# Patient Record
Sex: Male | Born: 1940 | Hispanic: No | Marital: Married | Smoking: Former smoker
Health system: Southern US, Community
[De-identification: ages and names within clinical notes are randomized; demographics above are authoritative.]

## PROBLEM LIST (undated history)

## (undated) DIAGNOSIS — I10 Essential (primary) hypertension: Secondary | ICD-10-CM

---

## 2016-06-07 ENCOUNTER — Ambulatory Visit (HOSPITAL_BASED_OUTPATIENT_CLINIC_OR_DEPARTMENT_OTHER)
Admission: RE | Admit: 2016-06-07 | Discharge: 2016-06-07 | Disposition: A | Discharge status: Home / Self Care | Payer: Medicare Other | Source: Ambulatory Visit | Attending: Sports Medicine | Admitting: Sports Medicine

## 2016-06-07 ENCOUNTER — Ambulatory Visit (INDEPENDENT_AMBULATORY_CARE_PROVIDER_SITE_OTHER): Payer: Medicare Other | Admitting: Sports Medicine

## 2016-06-07 ENCOUNTER — Encounter (HOSPITAL_BASED_OUTPATIENT_CLINIC_OR_DEPARTMENT_OTHER): Payer: Self-pay

## 2016-06-07 ENCOUNTER — Encounter: Payer: Self-pay | Admitting: Sports Medicine

## 2016-06-07 ENCOUNTER — Ambulatory Visit (INDEPENDENT_AMBULATORY_CARE_PROVIDER_SITE_OTHER): Payer: Medicare Other

## 2016-06-07 VITALS — BP 190/106 | HR 51 | Resp 18 | Ht 65.25 in | Wt 162.1 lb

## 2016-06-07 DIAGNOSIS — M438X6 Other specified deforming dorsopathies, lumbar region: Secondary | ICD-10-CM | POA: Diagnosis not present

## 2016-06-07 DIAGNOSIS — I723 Aneurysm of iliac artery: Secondary | ICD-10-CM | POA: Diagnosis not present

## 2016-06-07 DIAGNOSIS — I10 Essential (primary) hypertension: Secondary | ICD-10-CM | POA: Diagnosis not present

## 2016-06-07 DIAGNOSIS — I714 Abdominal aortic aneurysm, without rupture, unspecified: Secondary | ICD-10-CM

## 2016-06-07 DIAGNOSIS — M545 Low back pain, unspecified: Secondary | ICD-10-CM | POA: Insufficient documentation

## 2016-06-07 DIAGNOSIS — K219 Gastro-esophageal reflux disease without esophagitis: Secondary | ICD-10-CM

## 2016-06-07 DIAGNOSIS — Z0000 Encounter for general adult medical examination without abnormal findings: Secondary | ICD-10-CM | POA: Insufficient documentation

## 2016-06-07 DIAGNOSIS — K5900 Constipation, unspecified: Secondary | ICD-10-CM | POA: Diagnosis not present

## 2016-06-07 DIAGNOSIS — H8113 Benign paroxysmal vertigo, bilateral: Secondary | ICD-10-CM | POA: Diagnosis not present

## 2016-06-07 DIAGNOSIS — I2510 Atherosclerotic heart disease of native coronary artery without angina pectoris: Secondary | ICD-10-CM | POA: Diagnosis not present

## 2016-06-07 DIAGNOSIS — M5136 Other intervertebral disc degeneration, lumbar region: Secondary | ICD-10-CM | POA: Diagnosis not present

## 2016-06-07 DIAGNOSIS — K5730 Diverticulosis of large intestine without perforation or abscess without bleeding: Secondary | ICD-10-CM | POA: Diagnosis not present

## 2016-06-07 DIAGNOSIS — I700 Atherosclerosis of aorta: Secondary | ICD-10-CM | POA: Diagnosis not present

## 2016-06-07 DIAGNOSIS — G4700 Insomnia, unspecified: Secondary | ICD-10-CM | POA: Insufficient documentation

## 2016-06-07 DIAGNOSIS — N400 Enlarged prostate without lower urinary tract symptoms: Secondary | ICD-10-CM | POA: Insufficient documentation

## 2016-06-07 DIAGNOSIS — E785 Hyperlipidemia, unspecified: Secondary | ICD-10-CM | POA: Insufficient documentation

## 2016-06-07 DIAGNOSIS — Z955 Presence of coronary angioplasty implant and graft: Secondary | ICD-10-CM

## 2016-06-07 DIAGNOSIS — H8110 Benign paroxysmal vertigo, unspecified ear: Secondary | ICD-10-CM | POA: Insufficient documentation

## 2016-06-07 DIAGNOSIS — R911 Solitary pulmonary nodule: Secondary | ICD-10-CM | POA: Insufficient documentation

## 2016-06-07 HISTORY — DX: Essential (primary) hypertension: I10

## 2016-06-07 LAB — POCT URINALYSIS DIPSTICK
Bilirubin, UA: NEGATIVE
Blood, UA: NEGATIVE
Glucose, UA: NEGATIVE
Ketones, UA: NEGATIVE
Leukocytes, UA: NEGATIVE
Nitrite, UA: NEGATIVE
Protein, UA: NEGATIVE
Spec Grav, UA: 1.02
Urobilinogen, UA: 0.2
pH, UA: 5.5

## 2016-06-07 LAB — COMPLETE METABOLIC PANEL WITH GFR
Albumin: 4.5 g/dL (ref 3.6–5.1)
Alkaline Phosphatase: 54 U/L (ref 40–115)
Calcium: 10.4 mg/dL — ABNORMAL HIGH (ref 8.6–10.3)
GFR, Est Non African American: 60 mL/min (ref >=60)
Potassium: 4.4 mmol/L (ref 3.5–5.3)
Sodium: 139 mmol/L (ref 135–146)

## 2016-06-07 LAB — COMPLETE METABOLIC PANEL WITHOUT GFR
ALT: 12 U/L (ref 9–46)
AST: 19 U/L (ref 10–35)
BUN: 14 mg/dL (ref 7–25)
CO2: 23 mmol/L (ref 20–31)
Chloride: 100 mmol/L (ref 98–110)
Creat: 1.17 mg/dL (ref 0.70–1.18)
GFR, Est African American: 70 mL/min (ref >=60)
Glucose, Bld: 99 mg/dL (ref 65–99)
Total Bilirubin: 0.5 mg/dL (ref 0.2–1.2)
Total Protein: 7.8 g/dL (ref 6.1–8.1)

## 2016-06-07 MED ORDER — IOPAMIDOL (ISOVUE-370) INJECTION 76%
100.0000 mL | Freq: Once | INTRAVENOUS | Status: AC | PRN
  Administered 2016-06-07: 100 mL via INTRAVENOUS

## 2016-06-07 MED ORDER — MECLIZINE HCL 12.5 MG PO TABS: 12.5000 mg | ORAL_TABLET | Freq: Two times a day (BID) | ORAL | Status: AC

## 2016-06-07 MED ORDER — PREDNISONE 50 MG PO TABS: ORAL_TABLET | ORAL | Status: DC

## 2016-06-07 MED ORDER — VALSARTAN-HYDROCHLOROTHIAZIDE 320-25 MG PO TABS: 0.5000 | ORAL_TABLET | Freq: Every day | ORAL | Status: DC

## 2016-06-07 NOTE — Assessment & Plan Note (Signed)
Patient will take meclizine twice a day, referral to ENT, he does have some tinnitus that is suspicious for Mnire's disease. Adding vestibular rehabilitation.

## 2016-06-07 NOTE — Addendum Note (Signed)
Addended by: Monica BectonHEKKEKANDAM, Major Santerre J on: 06/07/2016 01:32 PM   Modules accepted: Orders

## 2016-06-07 NOTE — Addendum Note (Signed)
Addended by: Baird KayUGLAS, Koree Staheli M on: 06/07/2016 04:54 PM   Modules accepted: Orders

## 2016-06-07 NOTE — Addendum Note (Signed)
Addended by: Chalmers CaterUTTLE, Chasitee Zenker H on: 06/07/2016 02:54 PM   Modules accepted: Orders

## 2016-06-07 NOTE — Assessment & Plan Note (Signed)
Rechecking lipids. He is getting some myalgias, checking his CK level and ESR 

## 2016-06-07 NOTE — Assessment & Plan Note (Signed)
Adding screening for aortic aneurysm, patient is a former smoker

## 2016-06-07 NOTE — Progress Notes (Signed)
  Subjective:    CC: Establish care.   HPI:  This is a pleasant 75 year old male establishing care previous [place] in [place]. He has multiple complaints.  Back pain: Axial with radiation around to the anterior abdomen, no constitutional symptoms or hematuria. No dysuria. Worse with sitting, flexion, Valsalva, no overt radicular symptoms.  Dizziness: Described as predominant vertiginous with head movement, was given meclizine which he takes intermittently for a partial and temporary response.  Has never done vestibular rehabilitation, denies any symptoms of presyncope. In further questioning does have some moderate to severe tinnitus associated with the dizzy spells.  Hypertension: Uncontrolled, no headaches, visual changes, chest pain  Hyperlipidemia: Stable on pravastatin, is getting some hip girdle muscle aching  BPH: Stable on current medications  GERD: Stable on current medications  Insomnia: Stable with occasional Ambien use prescribed by another provider.  Coronary artery disease: Had an angiography with stenting proximally one year ago, no further episodes of chest pain.  Past medical history, Surgical history, Family history not pertinant except as noted below, Social history, Allergies, and medications have been entered into the medical record, reviewed, and no changes needed.   Review of Systems: No headache, visual changes, nausea, vomiting, diarrhea, constipation, dizziness, abdominal pain, skin rash, fevers, chills, night sweats, swollen lymph nodes, weight loss, chest pain, body aches, joint swelling, muscle aches, shortness of breath, mood changes, visual or auditory hallucinations.  Objective:    General: Well Developed, well nourished, and in no acute distress.  Neuro: Alert and oriented x3, extra-ocular muscles intact, sensation grossly intact.  HEENT: Normocephalic, atraumatic, pupils equal round reactive to light, neck supple, no masses, no lymphadenopathy, thyroid  nonpalpable. Oropharynx, nasopharynx, ear canals unremarkable, mild tympanosclerosis. Skin: Warm and dry, no rashes noted.  Cardiac: Regular rate and rhythm, no murmurs rubs or gallops.  Respiratory: Clear to auscultation bilaterally. Not using accessory muscles, speaking in full sentences.  Abdominal: Soft, nontender, nondistended, positive bowel sounds, no masses, no organomegaly.  Musculoskeletal: Shoulder, elbow, wrist, hip, knee, ankle stable, and with full range of motion. Back Exam:  Inspection: Unremarkable  Motion: Flexion 45 deg, Extension 45 deg, Side Bending to 45 deg bilaterally,  Rotation to 45 deg bilaterally  SLR laying: Negative  XSLR laying: Negative  Palpable tenderness: None. FABER: negative. Sensory change: Gross sensation intact to all lumbar and sacral dermatomes.  Reflexes: 2+ at both patellar tendons, 2+ at achilles tendons, Babinski's downgoing.  Strength at foot  Plantar-flexion: 5/5 Dorsi-flexion: 5/5 Eversion: 5/5 Inversion: 5/5  Leg strength  Quad: 5/5 Hamstring: 5/5 Hip flexor: 5/5 Hip abductors: 5/5  Gait unremarkable.   Impression and Recommendations:    The patient was counselled, risk factors were discussed, anticipatory guidance given.  I spent 60 minutes with this patient, greater than 50% was face-to-face time counseling regarding the above multiple diagnoses.

## 2016-06-07 NOTE — Assessment & Plan Note (Signed)
Noted calcified aneurysmal dilation rotation of the abdominal aorta on x-rays, combined with back pain and a blood pressure that highly elevated we do need an urgent abdominal aortic ultrasound, we cannot do a downstairs, patient will be diverted to [facility] for STAT CT abdomen and pelvis.

## 2016-06-07 NOTE — Assessment & Plan Note (Signed)
With a systolic murmur. Adding an echocardiogram. Blood work ordered.

## 2016-06-07 NOTE — Assessment & Plan Note (Signed)
Post MI with stenting. Continue the Bisoprolol, adding valsartan/HCTZ

## 2016-06-07 NOTE — Assessment & Plan Note (Signed)
X-rays, prednisone formal physical therapy, MRI. We are going to proceed with interventional planning, he has had physical therapy in [place] without any improvement.

## 2016-06-08 LAB — CBC
HCT: 45.2 % (ref 38.5–50.0)
Hemoglobin: 15.2 g/dL (ref 13.2–17.1)
MCH: 30.8 pg (ref 27.0–33.0)
MCHC: 33.6 g/dL (ref 32.0–36.0)
MCV: 91.5 fL (ref 80.0–100.0)
MPV: 11.1 fL (ref 7.5–12.5)
Platelets: 141 K/uL (ref 140–400)
RBC: 4.94 MIL/uL (ref 4.20–5.80)
RDW: 14.2 % (ref 11.0–15.0)
WBC: 6.4 10*3/uL (ref 3.8–10.8)

## 2016-06-08 LAB — TSH: TSH: 2.29 m[IU]/L (ref 0.40–4.50)

## 2016-06-08 LAB — HEMOGLOBIN A1C
Hgb A1c MFr Bld: 6.1 % — ABNORMAL HIGH (ref <5.7)
Mean Plasma Glucose: 128 mg/dL

## 2016-06-08 LAB — LIPID PANEL
Cholesterol: 178 mg/dL (ref 125–200)
HDL: 35 mg/dL — ABNORMAL LOW (ref >=40)
Total CHOL/HDL Ratio: 5.1 Ratio — ABNORMAL HIGH (ref <=5.0)
Triglycerides: 482 mg/dL — ABNORMAL HIGH (ref <150)

## 2016-06-08 LAB — CK: Total CK: 56 U/L (ref 7–232)

## 2016-06-08 LAB — SEDIMENTATION RATE: Sed Rate: 4 mm/hr (ref 0–20)

## 2016-06-10 ENCOUNTER — Ambulatory Visit (HOSPITAL_BASED_OUTPATIENT_CLINIC_OR_DEPARTMENT_OTHER)
Admission: RE | Admit: 2016-06-10 | Discharge: 2016-06-10 | Disposition: A | Discharge status: Home / Self Care | Payer: Medicare Other | Source: Ambulatory Visit | Attending: Sports Medicine | Admitting: Sports Medicine

## 2016-06-10 ENCOUNTER — Other Ambulatory Visit: Payer: Self-pay | Admitting: Sports Medicine

## 2016-06-10 DIAGNOSIS — I700 Atherosclerosis of aorta: Secondary | ICD-10-CM | POA: Insufficient documentation

## 2016-06-10 DIAGNOSIS — Z87891 Personal history of nicotine dependence: Secondary | ICD-10-CM | POA: Insufficient documentation

## 2016-06-10 DIAGNOSIS — I723 Aneurysm of iliac artery: Secondary | ICD-10-CM

## 2016-06-10 DIAGNOSIS — Z0000 Encounter for general adult medical examination without abnormal findings: Secondary | ICD-10-CM | POA: Insufficient documentation

## 2016-06-11 ENCOUNTER — Encounter: Payer: Self-pay | Admitting: Physical Therapy

## 2016-06-11 ENCOUNTER — Ambulatory Visit: Payer: Medicare Other | Attending: Sports Medicine | Admitting: Physical Therapy

## 2016-06-11 DIAGNOSIS — M545 Low back pain: Secondary | ICD-10-CM | POA: Insufficient documentation

## 2016-06-11 DIAGNOSIS — H8112 Benign paroxysmal vertigo, left ear: Secondary | ICD-10-CM | POA: Diagnosis present

## 2016-06-11 DIAGNOSIS — R42 Dizziness and giddiness: Secondary | ICD-10-CM | POA: Diagnosis present

## 2016-06-11 DIAGNOSIS — R2681 Unsteadiness on feet: Secondary | ICD-10-CM | POA: Insufficient documentation

## 2016-06-11 DIAGNOSIS — R2689 Other abnormalities of gait and mobility: Secondary | ICD-10-CM | POA: Diagnosis present

## 2016-06-11 NOTE — Therapy (Signed)
[facility] [address] [place], [place], [address] Phone: [phone]   Fax:  [phone]  Physical Therapy Evaluation  Patient Details  Name: Dominic Wallace MRN: [id number] Date of Birth: 1941-10-09 Referring Provider: Darlin Priestly, MD  Encounter Date: 06/11/2016      PT End of Session - 06/11/16 1002    Visit Number 1   Number of Visits 9  eval + 8 visits   Date for PT Re-Evaluation 07/11/16   Authorization Type BCBS Medicare primary; [employer] secondary   Authorization Time Period G Codes required   PT Start Time [phone]   PT Stop Time 0936   PT Time Calculation (min) 53 min   Activity Tolerance Patient tolerated treatment well   Behavior During Therapy [facility] for tasks assessed/performed      Past Medical History  Diagnosis Date  . Hypertension     History reviewed. No pertinent past surgical history.  There were no vitals filed for this visit.       Subjective Assessment - 06/11/16 0853    Subjective Dizziness began after CVA approx. 1 year ago. Was under the impression that dizziness would resolve after surgery for AAA, but it did not. Notes BP has been elevated recently; Dr. Karie Schwalbe increased meds at last office visit on 7/14. Reports "dizzy attacks" occur when standing on his feet.    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 No/denies            [facility] PT Assessment - 06/11/16 0001    Assessment   Medical Diagnosis Benign paroxysmal positional vertigo, bilateral   Referring Provider Darlin Priestly, MD   Onset Date/Surgical Date 05/26/15   Precautions   Precautions Fall   Restrictions   Weight Bearing Restrictions No   Balance Screen   Has the patient fallen in the  past 6 months No   Has the patient had a decrease in activity level because of a fear of falling?  Yes   Is the patient reluctant to leave their home because of a fear of falling?  No   Home Environment   Living Environment Private residence   Living Arrangements Spouse/significant other   Type of Home House   Home Access Level entry   Home Layout One level   Home Equipment Walker - 2 wheels;Gilmer Mor - single point   Prior Function   Level of Independence Independent   Vocation Retired   Leisure likes to play with grandkids and great grandkids   Cognition   Overall Cognitive Status History of cognitive impairments - at baseline  impaired ST memory, per pt/wife.            Vestibular Assessment - 06/11/16 0001    Vestibular Assessment   General Observation Pt did take meclizine this morning.   Symptom Behavior   Type of Dizziness Spinning  and secondary imbalance   Frequency of Dizziness spinning occurs intermittently; imbalance occurs everytime pt stands/ambulates   Duration of Dizziness spinning: hours; imbalance: as long as head/body moving   Aggravating Factors Activity in general;Spontaneous onset;Turning body quickly;Turning head quickly;Supine to sit   Relieving Factors No known relieving factors   Occulomotor Exam   Occulomotor Alignment Abnormal  R eye abducted  Spontaneous Absent   Gaze-induced Left beating nystagmus with L gaze   Smooth Pursuits Intact  grossly   Saccades Dysmetria  hypometric in all planes but hypermetric with horizont R > L   Comment Convergence appears impaired in R eye.   Vestibulo-Occular Reflex   VOR 1 Head Only (x 1 viewing) Impaired and symptomatic with horizontal head movement.   VOR Cancellation Normal   Comment (+) B Head Thrust Test   Positional Testing   Dix-Hallpike Dix-Hallpike Right;Dix-Hallpike Left   Sidelying Test Sidelying Right;Sidelying Left   Horizontal Canal Testing Horizontal Canal Right;Horizontal Canal  Left;Horizontal Canal Right Intensity;Horizontal Canal Left Intensity   Dix-Hallpike Right   Dix-Hallpike Right Duration NA   Dix-Hallpike Right Symptoms No nystagmus   Dix-Hallpike Left   Dix-Hallpike Left Duration Approx 5 seconds   Dix-Hallpike Left Symptoms Upbeat, left rotatory nystagmus   Sidelying Right   Sidelying Right Duration NA   Sidelying Right Symptoms No nystagmus   Sidelying Left   Sidelying Left Duration NA   Sidelying Left Symptoms No nystagmus   Horizontal Canal Right   Horizontal Canal Right Duration NA   Horizontal Canal Right Symptoms Normal   Horizontal Canal Left   Horizontal Canal Left Duration NA   Horizontal Canal Left Symptoms Normal   Orthostatics   BP sitting 168/98 mmHg  manual BP   HR sitting 53   Orthostatics Comment When taken with dynamap, BP is 180/100 (seated).               [facility] Adult PT Treatment/Exercise - 06/11/16 0001    Bed Mobility   Bed Mobility Supine to Sit;Sit to Supine   Supine to Sit 4: Min assist   Supine to Sit Details (indicate cue type and reason) cueing for technique   Sit to Supine 4: Min assist   Sit to Supine - Details (indicate cue type and reason) cueing for technique   Transfers   Transfers Sit to Stand;Stand to Sit   Sit to Stand 5: Supervision;4: Min guard   Stand to Sit 5: Supervision   Ambulation/Gait   Ambulation/Gait Yes   Ambulation/Gait Assistance 5: Supervision;4: Min guard   Ambulation Distance (Feet) 130 Feet  x2   Assistive device None   Gait Pattern Step-through pattern;Decreased arm swing - right;Decreased arm swing - left;Decreased stride length;Lateral trunk lean to right;Wide base of support  BUE's in high guard position; turns en bloc   Ambulation Surface Level;Indoor         Vestibular Treatment/Exercise - 06/11/16 0001    Vestibular Treatment/Exercise   Vestibular Treatment Provided Canalith Repositioning   Canalith Repositioning Epley Manuever Left    EPLEY MANUEVER LEFT    Number of Reps  1    RESPONSE DETAILS LEFT Unable to reassess due to time constraint.               PT Education - 06/11/16 0947    Education provided Yes   Education Details PT eval findings, goals, and POC. Explained risk of elevated BP given h/o CVA. Recommended pt contact primary MD immediately after session to notify of elevated BP.    Person(s) Educated Patient;Spouse   Methods Explanation   Comprehension Verbalized understanding          PT Short Term Goals - 06/11/16 1004    PT SHORT TERM GOAL #1   Title STG's = LTG's           PT Long Term Goals - 06/11/16 1004  PT LONG TERM GOAL #1   Title Rule out orthostatic hypotension as origin of dizziness. (Target date: 07/09/16)   PT LONG TERM GOAL #2   Title Pt will perform HEP with mod I using paper handout to maximize functional gains made in PT.  (06/29/16)   PT LONG TERM GOAL #3   Title Positional vertigo testing will be negative to indicate resolved BPPV.    (06/29/16)   PT LONG TERM GOAL #4   Title Pt will decrease DHI score from 72 to < / = 54 to indicate significant decrease in pt-perceived disability due to dizziness. (06/29/16)   PT LONG TERM GOAL #5   Title Complete Berg and write appropriate LTG.  (06/29/16)               Plan - 06/11/16 1009    Clinical Impression Statement Pt is a 75 y/o M referred to outpatient PT to address vertiginous symptoms. 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 PT evaluation reveals the following: impaired convergence and dysmetric saccades on oculomotor exam; (+) B Head Thrust Test; L Dix-Hallpike wiht L upbeating torsional nystagmus x5 seconds (although pt did take meclizine today) accompanied by concordant vertigo; motion sensitivity with supine > sit; and decreased stability/independence with all aspects of functional mobility. Unable to rule out L posterior canalithiasis, vestibular hypofunction, and impaired visual-vestibular  integration. Performed L Epley maneuver x1; unable to reassess due to time constraint. Seated BP was 168/98; strongly reconmmended pt/wife contact referring MD immediately after this session to notify of continued elevatation of BP despite recent medication change. Pt.wife verbalized understanding and were in full agreement. Pt will benefit from skilled outpatient PT 2x/week for 4 weeks to address said impairments.    Rehab Potential Fair   PT Frequency 2x / week   PT Duration 4 weeks   PT Treatment/Interventions ADLs/Self Care Home Management;Canalith Repostioning;Vestibular;Gait training;Stair training;Neuromuscular re-education;Functional mobility training;Therapeutic activities;Patient/family education;Therapeutic exercise;Balance training;Manual techniques   PT Next Visit Plan Assess orthostatic vital signs. Reassess for BPPV (check to see if pt has had meclizine) and treat prn.   Consulted and Agree with Plan of Care Patient;Family member/caregiver   Family Member Consulted wife, Corrie DandyMary      Patient will benefit from skilled therapeutic intervention in order to improve the following deficits and impairments:  Abnormal gait, Dizziness, Decreased balance  Visit Diagnosis: BPPV (benign paroxysmal positional vertigo), left - Plan: PT plan of care cert/re-cert  Dizziness and giddiness - Plan: PT plan of care cert/re-cert  Other abnormalities of gait and mobility - Plan: PT plan of care cert/re-cert  Unsteadiness on feet - Plan: PT plan of care cert/re-cert      G-Codes - 06/11/16 1008    Functional Assessment Tool Used DHI = 72   Functional Limitation Self care   Self Care Current Status (U0454(G8987) At least 60 percent but less than 80 percent impaired, limited or restricted   Self Care Goal Status (U9811(G8988) At least 40 percent but less than 60 percent impaired, limited or restricted       Problem List Patient Active Problem List   Diagnosis Date Noted  . Essential hypertension, benign  06/07/2016  . Hyperlipidemia 06/07/2016  . Presence of stent in coronary artery in patient with coronary artery disease 06/07/2016  . Benign paroxysmal positional vertigo 06/07/2016  . Low back pain 06/07/2016  . Annual physical exam 06/07/2016  . BPH (benign prostatic hyperplasia) 06/07/2016  . GERD (gastroesophageal reflux disease) 06/07/2016  .  Insomnia 06/07/2016  . Aneurysm of abdominal aorta (HCC) 06/07/2016  Jorje Guild, PT, DPT [facility] [address] [place], [place], [address] Phone: [phone]   Fax:  [phone] 06/11/2016, 10:16 AM  Name: Bently Wyss MRN: [id number] Date of Birth: 1941/09/10

## 2016-06-16 ENCOUNTER — Ambulatory Visit (HOSPITAL_BASED_OUTPATIENT_CLINIC_OR_DEPARTMENT_OTHER)
Admission: RE | Admit: 2016-06-16 | Discharge: 2016-06-16 | Disposition: A | Discharge status: Home / Self Care | Payer: Medicare Other | Source: Ambulatory Visit | Attending: Sports Medicine | Admitting: Sports Medicine

## 2016-06-16 DIAGNOSIS — M5126 Other intervertebral disc displacement, lumbar region: Secondary | ICD-10-CM | POA: Diagnosis not present

## 2016-06-16 DIAGNOSIS — M545 Low back pain, unspecified: Secondary | ICD-10-CM

## 2016-06-16 DIAGNOSIS — M4806 Spinal stenosis, lumbar region: Secondary | ICD-10-CM | POA: Insufficient documentation

## 2016-06-16 DIAGNOSIS — M1288 Other specific arthropathies, not elsewhere classified, other specified site: Secondary | ICD-10-CM | POA: Insufficient documentation

## 2016-06-17 ENCOUNTER — Ambulatory Visit: Payer: Medicare Other | Admitting: Physical Therapy

## 2016-06-17 ENCOUNTER — Telehealth: Payer: Self-pay | Admitting: Sports Medicine

## 2016-06-17 MED ORDER — DUTASTERIDE-TAMSULOSIN HCL 0.5-0.4 MG PO CAPS: 1.0000 | ORAL_CAPSULE | Freq: Every day | ORAL | 3 refills | Status: DC

## 2016-06-17 NOTE — Telephone Encounter (Signed)
Pt's wife came in and stated he needs his Dutasteride/tamsulosin refilled. They stated they will be in [place] for the next couple months and not [place]. Thanks

## 2016-06-18 ENCOUNTER — Ambulatory Visit: Payer: Medicare Other | Admitting: Physical Therapy

## 2016-06-18 ENCOUNTER — Telehealth: Payer: Self-pay | Admitting: Physical Therapy

## 2016-06-18 DIAGNOSIS — R2689 Other abnormalities of gait and mobility: Secondary | ICD-10-CM

## 2016-06-18 DIAGNOSIS — M545 Low back pain, unspecified: Secondary | ICD-10-CM

## 2016-06-18 DIAGNOSIS — H8112 Benign paroxysmal vertigo, left ear: Secondary | ICD-10-CM

## 2016-06-18 DIAGNOSIS — R2681 Unsteadiness on feet: Secondary | ICD-10-CM

## 2016-06-18 DIAGNOSIS — R42 Dizziness and giddiness: Secondary | ICD-10-CM

## 2016-06-18 NOTE — Therapy (Signed)
[facility] [address] [place], [place], [address] Phone: [phone]   Fax:  [phone]  Physical Therapy Treatment  Patient Details  Name: Dominic Wallace MRN: [id number] Date of Birth: [birth year] Referring Provider: Rosalio Loud, MD  Encounter Date: 06/18/2016      PT End of Session - 06/18/16 2009    Visit Number 2   Number of Visits 9   Date for PT Re-Evaluation 07/18/16   Authorization Type BCBS Medicare primary; T[employer] secondary   Authorization Time Period G Codes required   PT Start Time 0928   PT Stop Time 1019   PT Time Calculation (min) 51 min   Activity Tolerance Patient tolerated treatment well   Behavior During Therapy [facility] for tasks assessed/performed      Past Medical History:  Diagnosis Date  . Hypertension     No past surgical history on file.  There were no vitals filed for this visit.      Subjective Assessment - 06/18/16 0929    Subjective "The dizziness is still there, and the vertigo as well. 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 Yes   Pain Score 3    Pain Location Back   Pain Orientation Lower;Right   Pain Type Acute pain   Pain Onset 1 to 4 weeks ago   Pain Frequency Intermittent   Aggravating Factors  bending over; sitting up from lying in bed            [facility] PT Assessment - 06/18/16 0001      Assessment   Medical Diagnosis Benign paroxysmal positional vertigo, bilateral; low back pain   Referring Provider Rosalio Loud, MD   Onset Date/Surgical Date 05/26/15            Vestibular Assessment - 06/18/16 0001      Vestibular  Assessment   General Observation Pt did take meclizine today.     Positional Testing   Dix-Hallpike Dix-Hallpike Right;Dix-Hallpike Left   Horizontal Canal Testing Horizontal Canal Right;Horizontal Canal Left     Dix-Hallpike Left   Dix-Hallpike Left Duration No vertiginous symtoms; however, pt states, "It feels like the dizziness could start."   Dix-Hallpike Left Symptoms No nystagmus     Sidelying Right   Sidelying Right Duration NA   Sidelying Right Symptoms No nystagmus     Sidelying Left   Sidelying Left Duration NA   Sidelying Left Symptoms No nystagmus     Horizontal Canal Right   Horizontal Canal Right Duration NA   Horizontal Canal Right Symptoms Normal     Horizontal Canal Left   Horizontal Canal Left Duration NA   Horizontal Canal Left Symptoms Normal     Orthostatics   BP supine (x 5 minutes) 145/72   HR supine (x 5 minutes) 46   BP standing (after 1 minute) 109/69  symptomatic   HR standing (after 1 minute) 51                 [facility] Adult PT Treatment/Exercise - 06/18/16 0001      Bed Mobility   Bed Mobility Sit to Supine;Supine to  Sit   Supine to Sit 5: Supervision   Supine to Sit Details (indicate cue type and reason) Verbal, tactile cueing for logroll technique for efficiency of movement, decreased back pain.   Sit to Supine 5: Supervision   Sit to Supine - Details (indicate cue type and reason) Verbal, tactile cueing for logroll technique.     Transfers   Transfers Sit to Stand;Stand to Sit   Sit to Stand 5: Supervision   Stand to Sit 5: Supervision     Posture/Postural Control   Posture/Postural Control Postural limitations   Postural Limitations Rounded Shoulders;Forward head;Posterior pelvic tilt;Increased thoracic kyphosis     Self-Care   Self-Care Other Self-Care Comments   Other Self-Care Comments  Discussed strategies for preventing, managing orthostatic hypotension with focus on hydration, postural adjustment, compression  garments, and performance of LE therex. Emphasized importance of static standing > 10 seconds prior to initiating ambulating to decrease fall risk. Pt verbalized understanding. Also explained and demonstrated how seated postural alignment can increase lower back pain. Pt gave effective within-session carryover of improved seated posture.     Exercises   Exercises Other Exercises   Other Exercises  With verbal/demo cueing from PT, pt effectively performed B hamstring self-stretch 2 x60-sec holds per LE.         Vestibular Treatment/Exercise - 06/18/16 0001      Vestibular Treatment/Exercise   Vestibular Treatment Provided Gaze   Gaze Exercises X1 Viewing Horizontal;X1 Viewing Vertical     X1 Viewing Horizontal   Foot Position seated; then standing with single UE support   Reps 3   Comments 1 x30-sec trial in seated; progressed to 2 x30-sec trials in standing with cueing for technique.     X1 Viewing Vertical   Foot Position standing with single UE support   Reps 2   Comments 2 x30-sec trials with cueing for technique               PT Education - 06/18/16 2004    Education provided Yes   Education Details HEP initiated; see Pt Instructions for details. Explained postural hypotension; see Self Care section for details.    Person(s) Educated Patient;Spouse   Methods Explanation;Demonstration;Handout;Verbal cues   Comprehension Verbalized understanding;Returned demonstration;Need further instruction          PT Short Term Goals - 06/11/16 1004      PT SHORT TERM GOAL #1   Title STG's = LTG's           PT Long Term Goals - 06/18/16 2016      PT LONG TERM GOAL #1   Title Rule out orthostatic hypotension as origin of dizziness. (Target date: 07/09/16)   Baseline Met 7/25.   Status Achieved     PT LONG TERM GOAL #2   Title Pt will perform HEP with mod I using paper handout to maximize functional gains made in PT.  (06/29/16)   Status On-going     PT LONG TERM  GOAL #3   Title Positional vertigo testing will be negative to indicate resolved BPPV.    (06/29/16)   Status On-going     PT LONG TERM GOAL #4   Title Pt will decrease DHI score from 72 to < / = 54 to indicate significant decrease in pt-perceived disability due to dizziness. (06/29/16)   Status On-going     PT LONG TERM GOAL #5   Title Complete Berg and write appropriate LTG.  (06/29/16)   Status On-going  Additional Long Term Goals   Additional Long Term Goals Yes     PT LONG TERM GOAL #6   Title Pt will consistently perform logroll technique with supine to/from sit to indicate improved body mechanics, decreased pain with bed mobility.    Status New     PT LONG TERM GOAL #7   Title Pt will participate in 30 consecutive minutes of exercise with no more than 2-point increase (on 10-point scale) in pain rating.    Status New               Plan - 06/18/16 2011    Clinical Impression Statement Reassessment for BPPV reveals motion sensitivity in L Dix-Hallpike, but no true vertigo or nystagmus with positional testing. Note that pt having taken meclizine today may be inhibiting nystagmus, symptoms. Assessment of orthostatic vital signs reveals symptoms decrease in BP from 145/72 to 109/69 with transition from supine > standing. Educated pt on prevention, managementof postural hypotension and sent note to PCP, who pt is scheduled to see tomorrow. HEP initiated; Nestor Lewandowsky not added to HEP due to significant BP finding. Added LTG's for back pain.   Rehab Potential Fair   PT Frequency 2x / week   PT Duration 4 weeks   PT Treatment/Interventions ADLs/Self Care Home Management;Canalith Repostioning;Vestibular;Gait training;Stair training;Neuromuscular re-education;Functional mobility training;Therapeutic activities;Patient/family education;Therapeutic exercise;Balance training;Manual techniques   PT Next Visit Plan Reassess for BPPV (check to see if pt has had meclizine) and treat prn.  Further assess back pain and treat. Complete Berg. Assess current HEP and expand, as appropriate.   Consulted and Agree with Plan of Care Patient;Family member/caregiver   Family Member Consulted wife, [name]      Patient will benefit from skilled therapeutic intervention in order to improve the following deficits and impairments:  Abnormal gait, Dizziness, Decreased balance, Improper body mechanics, Impaired flexibility, Postural dysfunction, Pain  Visit Diagnosis: Other abnormalities of gait and mobility - Plan: PT plan of care cert/re-cert  Unsteadiness on feet - Plan: PT plan of care cert/re-cert  Dizziness and giddiness - Plan: PT plan of care cert/re-cert  BPPV (benign paroxysmal positional vertigo), left - Plan: PT plan of care cert/re-cert  Left-sided low back pain without sciatica - Plan: PT plan of care cert/re-cert     Problem List Patient Active Problem List   Diagnosis Date Noted  . Essential hypertension, benign 06/07/2016  . Hyperlipidemia 06/07/2016  . Presence of stent in coronary artery in patient with coronary artery disease 06/07/2016  . Benign paroxysmal positional vertigo 06/07/2016  . Low back pain 06/07/2016  . Annual physical exam 06/07/2016  . BPH (benign prostatic hyperplasia) 06/07/2016  . GERD (gastroesophageal reflux disease) 06/07/2016  . Insomnia 06/07/2016  . Aneurysm of abdominal aorta (HCC) 06/07/2016    Billie Ruddy, PT, DPT [facility] [address] [place], [place], [address] Phone: [phone]   Fax:  [phone] 06/18/16, 8:25 PM  Name: Dominic Wallace MRN: [id number] Date of Birth: [birth year]

## 2016-06-18 NOTE — Telephone Encounter (Signed)
Dr. Benjamin Stain,  I just wanted to make you aware of Mr. Brodt orthostatic vital signs during our PT visit today:  - Supine x5 minutes: BP 145/72, HR 46 - Standing x1 minute: BP 109/69, HR 51 and symptomatic  Don't hesitate to contact me with any questions.  Thank you,  Jorje Guild, PT, DPT [facility] [address] [place], [place], [address] Phone: [phone]   Fax:  [phone] 06/18/16, 4:50 PM

## 2016-06-18 NOTE — Telephone Encounter (Signed)
Dr. Benjamin Stain,  I just wanted to make you aware of Mr. Memon orthostatic vital signs during our PT visit today:  - Supine x5 minutes: BP 145/72, HR 46 - Standing x1 minute: BP 109/69, HR 51 and asymptomatic  Don't hesitate to contact me with any questions.  Thank you,  Jorje Guild, PT, DPT [facility] [address] [place], [place], [address] Phone: [phone]   Fax:  [phone] 06/18/16, 4:50 PM

## 2016-06-18 NOTE — Patient Instructions (Signed)
    HAMSTRING STRETCH - TABLE, BED OR COUCH  Sit on a raised flat surface where you can prop your affected leg up on it such as a treatment table, couch or bed.    While keeping your knee straight to slightly bent, slowly lean forward and reach your hands towards your foot until a gentle stretch is felt along the back of your knee/thigh. Hold for 60 seconds and then return to starting position and repeat.  Perform this stretch 2-3 times per day on each leg.  Gaze Stabilization: Standing Feet Apart    Place your target ("A") at eye-level on the wall. Feet shoulder width apart, keeping eyes on target ("A")  on wall __5__ feet away, tilt head downslightly and move head side to side for _30___ seconds. Repeat while moving head up and down for __30__ seconds. Do __2__ sessions per day.  Gaze Stabilization: Tip Card  1.Target must remain in focus, not blurry, and appear stationary while head is in motion. 2.Perform exercises with small head movements (45 to either side of midline). 3.Increase speed of head motion so long as target is in focus.  Copyright  VHI. All rights reserved.

## 2016-06-19 ENCOUNTER — Ambulatory Visit (INDEPENDENT_AMBULATORY_CARE_PROVIDER_SITE_OTHER): Payer: Medicare Other | Admitting: Sports Medicine

## 2016-06-19 ENCOUNTER — Encounter: Payer: Self-pay | Admitting: Sports Medicine

## 2016-06-19 DIAGNOSIS — I714 Abdominal aortic aneurysm, without rupture, unspecified: Secondary | ICD-10-CM

## 2016-06-19 DIAGNOSIS — M545 Low back pain, unspecified: Secondary | ICD-10-CM

## 2016-06-19 DIAGNOSIS — I10 Essential (primary) hypertension: Secondary | ICD-10-CM

## 2016-06-19 NOTE — Assessment & Plan Note (Signed)
Blood pressure is now controlled. 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

## 2016-06-19 NOTE — Assessment & Plan Note (Signed)
Well controlled now with the addition of valsartan/hydrochlorothiazide.

## 2016-06-19 NOTE — Progress Notes (Signed)
  Subjective:    CC: Follow-up  HPI: Abdominal pain: Improved with prednisone. 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  Iliac and aortic aneurysm: Has an appointment coming up with vascular surgery, CT with contrast did not show any evidence of leak.  Hypertension: Now well controlled with the addition of valsartan/Hydrocort thiazide.  Past medical history, Surgical history, Family history not pertinant except as noted below, Social history, Allergies, and medications have been entered into the medical record, reviewed, and no changes needed.   Review of Systems: No fevers, chills, night sweats, weight loss, chest pain, or shortness of breath.   Objective:    General: Well Developed, well nourished, and in no acute distress.  Neuro: Alert and oriented x3, extra-ocular muscles intact, sensation grossly intact.  HEENT: Normocephalic, atraumatic, pupils equal round reactive to light, neck supple, no masses, no lymphadenopathy, thyroid nonpalpable.  Skin: Warm and dry, no rashes. Cardiac: Regular rate and rhythm, no murmurs rubs or gallops, no lower extremity edema.  Respiratory: Clear to auscultation bilaterally. Not using accessory muscles, speaking in full sentences.  Impression and Recommendations:    I spent 25 minutes with this patient, greater than 50% was face-to-face time counseling regarding the above diagnoses

## 2016-06-19 NOTE — Assessment & Plan Note (Signed)
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  Pain is predominantly left-sided axial and discogenic, we are going to proceed with a left L4-L5 interlaminar epidural. Patient will return to see me in one month after the injection. Facet joints can serve as future interventional targets.

## 2016-06-20 ENCOUNTER — Ambulatory Visit: Payer: Medicare Other | Admitting: Physical Therapy

## 2016-06-20 DIAGNOSIS — R2689 Other abnormalities of gait and mobility: Secondary | ICD-10-CM

## 2016-06-20 DIAGNOSIS — H8112 Benign paroxysmal vertigo, left ear: Secondary | ICD-10-CM | POA: Diagnosis not present

## 2016-06-20 DIAGNOSIS — R42 Dizziness and giddiness: Secondary | ICD-10-CM

## 2016-06-20 DIAGNOSIS — R2681 Unsteadiness on feet: Secondary | ICD-10-CM

## 2016-06-20 NOTE — Therapy (Signed)
Dominic Wallace [address] [place], [place], [address] Phone: [phone]   Fax:  [phone]  Physical Therapy Treatment  Patient Details  Name: Dominic Wallace MRN: [id number] Date of Birth: [birth year] Referring Provider: Rosalio Loud, MD  Encounter Date: 06/20/2016      PT End of Session - 06/20/16 1342    Visit Number 3   Number of Visits 9   Date for PT Re-Evaluation 07/18/16   Authorization Type BCBS Medicare primary; Tricare secondary   Authorization Time Period G Codes required   PT Start Time 0845   PT Stop Time 0934   PT Time Calculation (min) 49 min   Equipment Utilized During Treatment Gait belt   Activity Tolerance Patient tolerated treatment well   Behavior During Therapy [facility] for tasks assessed/performed      Past Medical History:  Diagnosis Date  . Hypertension     No past surgical history on file.  There were no vitals filed for this visit.      Subjective Assessment - 06/20/16 0850    Subjective "I didn't take the dizzy pill this morning." Saw Dr. Darene Lamer (PCP) yesterday; may be getting an epidural injection in L lower back. In reference to meclizine, pt's wife states, "Dr. Darene Lamer put him on it because of the blood pressure."   Patient is accompained by: Family member  wife, [name]   Pertinent History PMH significant for: aortic aneurysm s/p stent placement, CVA (05/2015), HTN, HLD   Patient Stated Goals "I want to be healthy again and get back to doing what I was doing."   Currently in Pain? Yes   Pain Score 3    Pain Location Back   Pain Orientation Left;Lower   Pain Descriptors / Indicators Aching   Pain Type Acute pain   Pain Onset 1 to 4 weeks ago   Pain Frequency Intermittent   Aggravating Factors  bending over; sitting up from lying in bed            [facility] PT Assessment - 06/20/16 0001      Standardized Balance Assessment   Standardized Balance Assessment Berg Balance Test      Berg Balance Test   Sit to Stand Able to stand without using hands and stabilize independently   Standing Unsupported Able to stand safely 2 minutes   Sitting with Back Unsupported but Feet Supported on Floor or Stool Able to sit safely and securely 2 minutes   Stand to Sit Sits safely with minimal use of hands   Transfers Able to transfer safely, minor use of hands   Standing Unsupported with Eyes Closed Able to stand 10 seconds safely   Standing Ubsupported with Feet Together Able to place feet together independently and stand 1 minute safely   From Standing, Reach Forward with Outstretched Arm Can reach confidently >25 cm (10")   From Standing Position, Pick up Object from Floor Able to pick up shoe, needs supervision   From Standing Position, Turn to Look Behind Over each Shoulder Looks behind from both sides and weight shifts well   Turn 360 Degrees Able to turn 360 degrees safely but slowly   Standing Unsupported, Alternately Place Feet on Step/Stool Able to stand independently and safely and complete 8 steps in 20 seconds   Standing Unsupported, One Foot in Front Able to plae foot ahead of the other independently and hold 30 seconds   Standing on One Leg Able to lift leg independently and hold equal  to or more than 3 seconds   Total Score 50            Vestibular Assessment - 06/20/16 0001      Vestibular Assessment   General Observation No meclizine today.     Symptom Behavior   Type of Dizziness Lightheadedness   Aggravating Factors Supine to sit;Sit to stand   Relieving Factors No known relieving factors     Positional Testing   Dix-Hallpike Dix-Hallpike Right;Dix-Hallpike Left   Horizontal Canal Testing Horizontal Canal Right;Horizontal Canal Left     Dix-Hallpike Right   Dix-Hallpike Right Duration NA   Dix-Hallpike Right Symptoms No nystagmus     Dix-Hallpike Left   Dix-Hallpike Left Duration NA   Dix-Hallpike Left Symptoms No nystagmus     Horizontal Canal  Right   Horizontal Canal Right Duration NA   Horizontal Canal Right Symptoms Normal     Horizontal Canal Left   Horizontal Canal Left Duration NA   Horizontal Canal Left Symptoms Normal                 [facility] Adult PT Treatment/Exercise - 06/20/16 0001      Transfers   Transfers Sit to Stand;Stand to Sit   Sit to Stand 5: Supervision;6: Modified independent (Device/Increase time)   Sit to Stand Details (indicate cue type and reason) initially required (S)/cueing for setup with effective within-session carryover   Stand to Sit 5: Supervision;6: Modified independent (Device/Increase time)     Ambulation/Gait   Ambulation/Gait Yes   Ambulation/Gait Assistance 5: Supervision;6: Modified independent (Device/Increase time)   Ambulation Distance (Feet) 200 Feet   Assistive device None   Gait Pattern Step-through pattern;Decreased stride length;Lateral trunk lean to right;Wide base of support  improved reciprocal arm swing during this session   Ambulation Surface Level;Indoor     Posture/Postural Control   Posture/Postural Control Postural limitations   Postural Limitations Rounded Shoulders;Forward head;Posterior pelvic tilt;Increased thoracic kyphosis   Posture Comments Ineffective between-session carryover of cueing provided during previous session for above postural impairments.             Balance Exercises - 06/20/16 1332      Balance Exercises: Standing   Standing Eyes Closed Wide (BOA);Head turns;Foam/compliant surface;Other reps (comment)  1 pillow; horizontal, vertical head turns x10 each   Tandem Stance Eyes open;Intermittent upper extremity support;4 reps;15 secs   SLS Eyes open;Intermittent upper extremity support;4 reps;15 secs           PT Education - 06/20/16 0855    Education provided Yes   Education Details Explained that meclizine is not prescribed for blood pressure, but rather for dizziness; recommended pt/wife direct all questions concerning  medications. Explained Berg findings and functional implications. Reviewed and expanded on vestibular/balance HEP.   Person(s) Educated Patient;Spouse   Methods Explanation;Demonstration;Verbal cues;Handout   Comprehension Verbalized understanding;Returned demonstration          PT Short Term Goals - 06/11/16 1004      PT SHORT TERM GOAL #1   Title STG's = LTG's           PT Long Term Goals - 06/20/16 1344      PT LONG TERM GOAL #1   Title Rule out orthostatic hypotension as origin of dizziness. (Target date: 07/09/16)   Baseline Met 7/25.   Status Achieved     PT LONG TERM GOAL #2   Title Pt will perform HEP with mod I using paper handout to maximize functional gains made  in PT.  (06/29/16)   Status On-going     PT LONG TERM GOAL #3   Title Positional vertigo testing will be negative to indicate resolved BPPV.    (06/29/16)   Baseline Met 7/27.   Status Achieved     PT LONG TERM GOAL #4   Title Pt will decrease DHI score from 72 to < / = 54 to indicate significant decrease in pt-perceived disability due to dizziness. (06/29/16)   Status On-going     PT LONG TERM GOAL #5   Title Complete Berg and write appropriate LTG.  (06/29/16)   Baseline Completed 7/27 with Berg score of 50/56.   Status Achieved     Additional Long Term Goals   Additional Long Term Goals Yes     PT LONG TERM GOAL #6   Title Pt will consistently perform logroll technique with supine to/from sit to indicate improved body mechanics, decreased pain with bed mobility.    Status On-going     PT LONG TERM GOAL #7   Title Pt will participate in 30 consecutive minutes of exercise with no more than 2-point increase (on 10-point scale) in pain rating.    Status On-going     PT LONG TERM GOAL #8   Title Pt will improve Berg score from 50/56 to >/= 53/56 to indicate significant improvement in standing balance.  (   Status New               Plan - 06/20/16 1345    Clinical Impression Statement All  positional testing negative and asymptomatic today (no meclizine today, per pt's wife), suggesting BPPV has resolved. Berg completed, with score of 50/56, suggesting low fall risk. Expanded on HEP to include standing balance impairments identified by Merrilee Jansky today. Pt tolerated interventions well.    Rehab Potential Fair   PT Frequency 2x / week   PT Duration 4 weeks   PT Treatment/Interventions ADLs/Self Care Home Management;Canalith Repostioning;Vestibular;Gait training;Stair training;Neuromuscular re-education;Functional mobility training;Therapeutic activities;Patient/family education;Therapeutic exercise;Balance training;Manual techniques   PT Next Visit Plan Assess HEP and progress prn. Further assess LBP and treat based on findings.    Consulted and Agree with Plan of Care Patient;Family member/caregiver   Family Member Consulted wife, [name]      Patient will benefit from skilled therapeutic intervention in order to improve the following deficits and impairments:  Abnormal gait, Dizziness, Decreased balance, Improper body mechanics, Impaired flexibility, Postural dysfunction, Pain  Visit Diagnosis: Other abnormalities of gait and mobility  Unsteadiness on feet  Dizziness and giddiness     Problem List Patient Active Problem List   Diagnosis Date Noted  . Essential hypertension, benign 06/07/2016  . Hyperlipidemia 06/07/2016  . Presence of stent in coronary artery in patient with coronary artery disease 06/07/2016  . Benign paroxysmal positional vertigo 06/07/2016  . Low back pain 06/07/2016  . Annual physical exam 06/07/2016  . BPH (benign prostatic hyperplasia) 06/07/2016  . GERD (gastroesophageal reflux disease) 06/07/2016  . Insomnia 06/07/2016  . Aneurysm of abdominal aorta (HCC) 06/07/2016    Billie Ruddy, PT, DPT [facility] [address] [place], [place], [address] Phone: [phone]   Fax:  [phone] 06/20/16, 1:48  PM  Name: Holger Sokolowski MRN: [id number] Date of Birth: 16-Oct-1941

## 2016-06-20 NOTE — Patient Instructions (Signed)
Patient Instructions       HAMSTRING STRETCH - TABLE, BED OR COUCH  Sit on a raised flat surface where you can prop your affected leg up on it such as a treatment table, couch or bed.    While keeping your knee straight to slightly bent, slowly lean forward and reach your hands towards your foot until a gentle stretch is felt along the back of your knee/thigh. Hold for 60 seconds and then return to starting position and repeat.  Perform this stretch 2-3 times per day on each leg.  Gaze Stabilization: Standing Feet Apart    Place your target ("A") at eye-level on the wall. Feet shoulder width apart, keeping eyes on target ("A")  on wall __5__ feet away, tilt head downslightly and move head side to side for _30___ seconds. Repeat while moving head up and down for __30__ seconds. Do __2__ sessions per day.  Gaze Stabilization: Tip Card  1.Target must remain in focus, not blurry, and appear stationary while head is in motion. 2.Perform exercises with small head movements (45 to either side of midline). 3.Increase speed of head motion so long as target is in focus.  Copyright  VHI. All rights reserved.   SINGLE LIMB STANCE    Stand facing your countertop. Hands close to countertop, using for balance as needed. Stand on one leg for 15 seconds without use of hands, if safe. Practice this 10 times per day on each leg.  Tandem Stance    Stand with countertop at your side. One hand close to countertop, using for balance as needed. Right foot in front of left, heel touching toe both feet "straight ahead". Stand on Foot Triangle of Support with both feet. Balance in this position _15__ seconds. Do with left foot in front of right. Perform 5 times per day on each leg.  Feet Apart (Compliant Surface) Head Motion - Eyes Closed    Stand with your back to a corner with a stable chair in front of you. Stand on 1 pillow with feet shoulder width apart. Close eyes and move head slowly:  up and down 10 times; and right to left 10 times.  Repeat 2-3 times per day.  Copyright  VHI. All rights reserved.

## 2016-06-21 ENCOUNTER — Ambulatory Visit: Payer: Medicare Other | Admitting: Sports Medicine

## 2016-06-25 ENCOUNTER — Ambulatory Visit: Payer: Medicare Other | Attending: Sports Medicine | Admitting: Physical Therapy

## 2016-06-25 ENCOUNTER — Telehealth: Payer: Self-pay | Admitting: Sports Medicine

## 2016-06-25 DIAGNOSIS — R2689 Other abnormalities of gait and mobility: Secondary | ICD-10-CM | POA: Diagnosis present

## 2016-06-25 DIAGNOSIS — R42 Dizziness and giddiness: Secondary | ICD-10-CM | POA: Insufficient documentation

## 2016-06-25 DIAGNOSIS — R2681 Unsteadiness on feet: Secondary | ICD-10-CM | POA: Diagnosis present

## 2016-06-25 MED ORDER — PRAVASTATIN SODIUM 40 MG PO TABS: 40.0000 mg | ORAL_TABLET | Freq: Every day | ORAL | 3 refills | Status: AC

## 2016-06-25 NOTE — Telephone Encounter (Signed)
pts wife came in and would like to know if you can refill his clopidogrel 75mg  and Pravastatin 40mg  and wend to the [facility] pharmacy in [place]. Thanks

## 2016-06-25 NOTE — Patient Instructions (Addendum)
Patient Instructions       HAMSTRING STRETCH - TABLE, BED OR COUCH  Sit on a raised flat surface where you can prop your affected leg up on it such as a treatment table, couch or bed.   While keeping your knee straight to slightly bent, slowly lean forward and reach your hands towards your foot until a gentle stretch is felt along the back of your knee/thigh. Hold for 60 seconds and then return to starting position and repeat.  Perform this stretch 2-3 times per day on each leg.  Gaze Stabilization: Standing Feet Apart    Place your target ("A") at eye-level on the wall. Feet shoulder width apart, keeping eyes on target ("A") on wall __5__ feet away, tilt head downslightly and move head side to side for _45___ seconds. Repeat while moving head up and down for __45__ seconds. Do __2__ sessions per day. Gaze Stabilization: Tip Card  1.Target must remain in focus, not blurry, and appear stationary while head is in motion. 2.Perform exercises with small head movements (45 to either side of midline). 3.Increase speed of head motion so long as target is in focus.  Copyright  VHI. All rights reserved.   SINGLE LIMB STANCE    Stand facing your countertop. Hands close to countertop, using for balance as needed. Stand on one leg for 15 seconds without use of hands, if safe. Practice this 10 times per day on each leg.  Tandem Walk    Walk along the length of your countertop (hand close to countertop, touching counter for balance, as needed) with heel of one foot touching toes of other foot. Practice for 2-3 minutes per day.  Feet Together (Compliant Surface) Head Motion - Eyes Closed    Stand with your back to a corner with a stable chair in front of you. Stand on 1 pillow  with feet together. Close eyes and move head slowly: up and down 10 times; right to left 10 times; diagonally up-right to down-left 10 times; and up-left to down-right 10 times. Repeat _2-3___ times  per session.  Copyright  VHI. All rights reserved.

## 2016-06-25 NOTE — Therapy (Signed)
[facility] [address] [place], [place], [address] Phone: [phone]   Fax:  [phone]  Physical Therapy Treatment  Patient Details  Name: Dominic Wallace MRN: [id number] Date of Birth: 1941-11-24 Referring Provider: Rosalio Loud, MD  Encounter Date: 06/25/2016      PT End of Session - 06/25/16 1422    Visit Number 4   Number of Visits 9   Date for PT Re-Evaluation 07/18/16   Authorization Type BCBS Medicare primary; [employer] secondary   PT Start Time 1016   PT Stop Time 1059   PT Time Calculation (min) 43 min   Activity Tolerance Patient tolerated treatment well   Behavior During Therapy [facility] for tasks assessed/performed      Past Medical History:  Diagnosis Date  . Hypertension     No past surgical history on file.  There were no vitals filed for this visit.      Subjective Assessment - 06/25/16 1021    Subjective "The back pain is much better...it's probably only about a 1 on a scale from 1 to 10." Pt no longer taking meclizine. No further lightheadedness with getting OOB in am; however, did not lightheadedness when bending over to pull weeds over the weekend.    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 Yes   Pain Score 1    Pain Location Back   Pain Orientation Left;Lower   Pain Descriptors / Indicators Aching   Pain Type Acute pain   Pain Onset 1 to 4 weeks ago   Pain Frequency Intermittent   Aggravating Factors  bending over; sitting up from lying in bed   Multiple Pain Sites No                         [facility] Adult PT Treatment/Exercise - 06/25/16 0001      Bed Mobility   Bed Mobility Supine to Sit;Sit to Supine   Supine to Sit 5: Supervision    Supine to Sit Details (indicate cue type and reason) verbal, tactile cueing for logroll technique for increased independence and efficiency, decreased pain with supine <> sit   Sit to Supine 6: Modified independent (Device/Increase time)     Transfers   Transfers Sit to Stand   Sit to Stand 6: Modified independent (Device/Increase time)   Stand to Sit 6: Modified independent (Device/Increase time)     Ambulation/Gait   Ambulation/Gait Yes   Ambulation/Gait Assistance 6: Modified independent (Device/Increase time);5: Supervision   Ambulation/Gait Assistance Details Mod I for gait over level, indoor surfaces and for gait without head turns; (S) for gait over unlevel, grass surfaces with head turns. Cueing for increased reciprocal arm swing.   Ambulation Distance (Feet) 600 Feet  x200' indoors, x400' outdoors   Assistive device None   Gait Pattern Step-through pattern;Decreased stride length;Decreased arm swing - right;Decreased arm swing - left  BOS more nromalized today as compared with previous sessions   Ambulation Surface Level;Unlevel;Indoor;Outdoor;Paved;Grass   Ramp 5: Supervision   Ramp Details (indicate cue type and reason) no AD   Curb 5: Supervision   Curb Details (indicate cue type and reason) no AD         Vestibular Treatment/Exercise -  06/25/16 0001      Vestibular Treatment/Exercise   Vestibular Treatment Provided Gaze   Gaze Exercises X1 Viewing Horizontal;X1 Viewing Vertical     X1 Viewing Horizontal   Foot Position standing; shoulder width   Reps 2   Comments 1 x30-sec trial, then 1 x45 seconds     X1 Viewing Vertical   Foot Position standing; shoulder width   Reps 1   Comments 45 seconds with effective between-session carryover            Balance Exercises - 06/25/16 1419      Balance Exercises: Standing   Standing Eyes Closed Narrow base of support (BOS);Head turns;Foam/compliant surface;Other reps (comment)  1 pillow; horizontal, vertical head  turns x10 each   Tandem Stance Eyes open;2 reps;10 secs   SLS Eyes open;Solid surface;Intermittent upper extremity support;4 reps  x8-10 sec per side   Gait with Head Turns Forward;Other (comment)  horiz, vertical head turns over grass, min guard   Tandem Gait Forward;Intermittent upper extremity support;2 reps  2 x10' at [place] 2 reps  2 x30' with (S)    Turning Both;3 reps  increased imbalance/disequilibrium           PT Education - 06/25/16 1414    Education provided Yes   Education Details HEP progressed; see Pt Instructions for details. Explained and demonstrated technique for pulling weeds in which head remains above heart-level to decrease lightheadedness when doing yard work. Reviewed logroll technique for supine <> sit.   Methods Explanation;Demonstration;Handout;Verbal cues   Comprehension Verbalized understanding;Returned demonstration;Need further instruction  May need to review during future sessions          PT Short Term Goals - 06/11/16 1004      PT SHORT TERM GOAL #1   Title STG's = LTG's           PT Long Term Goals - 06/20/16 1344      PT LONG TERM GOAL #1   Title Rule out orthostatic hypotension as origin of dizziness. (Target date: 07/09/16)   Baseline Met 7/25.   Status Achieved     PT LONG TERM GOAL #2   Title Pt will perform HEP with mod I using paper handout to maximize functional gains made in PT.  (06/29/16)   Status On-going     PT LONG TERM GOAL #3   Title Positional vertigo testing will be negative to indicate resolved BPPV.    (06/29/16)   Baseline Met 7/27.   Status Achieved     PT LONG TERM GOAL #4   Title Pt will decrease DHI score from 72 to < / = 54 to indicate significant decrease in pt-perceived disability due to dizziness. (06/29/16)   Status On-going     PT LONG TERM GOAL #5   Title Complete Berg and write appropriate LTG.  (06/29/16)   Baseline Completed 7/27 with Berg score of 50/56.   Status Achieved      Additional Long Term Goals   Additional Long Term Goals Yes     PT LONG TERM GOAL #6   Title Pt will consistently perform logroll technique with supine to/from sit to indicate improved body mechanics, decreased pain with bed mobility.    Status On-going     PT LONG TERM GOAL #7   Title Pt will participate in 30 consecutive minutes of exercise with no more than 2-point increase (on 10-point scale) in pain rating.    Status On-going  PT LONG TERM GOAL #8   Title Pt will improve Berg score from 50/56 to >/= 53/56 to indicate significant improvement in standing balance.  (   Status New               Plan - 06/25/16 1423    Clinical Impression Statement Pt demonstrates significant improvement in gait stability, standing balance since beginning this episode of PT. Pt very motivated and compliant with home exercises. HEP progressed during this session. Pt reports dizziness has improved markedly, but continues to experience dizziness with quick turns and demonstrates gait instability with head turns while ambulating over unlevel surfaces.   Rehab Potential Fair   PT Frequency 2x / week   PT Duration 4 weeks   PT Treatment/Interventions ADLs/Self Care Home Management;Canalith Repostioning;Vestibular;Gait training;Stair training;Neuromuscular re-education;Functional mobility training;Therapeutic activities;Patient/family education;Therapeutic exercise;Balance training;Manual techniques   PT Next Visit Plan Continue high level balance and dynamic gait activities. Consider adding 360-degree turns to HEP.   Consulted and Agree with Plan of Care Patient;Family member/caregiver   Family Member Consulted wife, [name]      Patient will benefit from skilled therapeutic intervention in order to improve the following deficits and impairments:  Abnormal gait, Dizziness, Decreased balance, Improper body mechanics, Impaired flexibility, Postural dysfunction, Pain  Visit Diagnosis: Other  abnormalities of gait and mobility  Unsteadiness on feet  Dizziness and giddiness     Problem List Patient Active Problem List   Diagnosis Date Noted  . Essential hypertension, benign 06/07/2016  . Hyperlipidemia 06/07/2016  . Presence of stent in coronary artery in patient with coronary artery disease 06/07/2016  . Benign paroxysmal positional vertigo 06/07/2016  . Low back pain 06/07/2016  . Annual physical exam 06/07/2016  . BPH (benign prostatic hyperplasia) 06/07/2016  . GERD (gastroesophageal reflux disease) 06/07/2016  . Insomnia 06/07/2016  . Aneurysm of abdominal aorta (HCC) 06/07/2016    Billie Ruddy, PT, DPT [facility] [address] [place], [place], [address] Phone: [phone]   Fax:  [phone] 06/25/16, 2:26 PM  Name: Faisal Stradling MRN: [id number] Date of Birth: 1941/04/29

## 2016-06-25 NOTE — Telephone Encounter (Signed)
Ok I let pt know. Thanks

## 2016-06-25 NOTE — Telephone Encounter (Signed)
I will refill the pravastatin, his cardiologist needs to refill the plavix

## 2016-06-27 ENCOUNTER — Encounter (HOSPITAL_BASED_OUTPATIENT_CLINIC_OR_DEPARTMENT_OTHER): Payer: Self-pay | Admitting: *Deleted

## 2016-06-27 ENCOUNTER — Emergency Department (HOSPITAL_BASED_OUTPATIENT_CLINIC_OR_DEPARTMENT_OTHER)
Admission: EM | Admit: 2016-06-27 | Discharge: 2016-06-28 | Disposition: A | Discharge status: Home / Self Care | Payer: Medicare Other | Attending: Emergency Medicine | Admitting: Emergency Medicine

## 2016-06-27 ENCOUNTER — Emergency Department (HOSPITAL_BASED_OUTPATIENT_CLINIC_OR_DEPARTMENT_OTHER): Payer: Medicare Other

## 2016-06-27 DIAGNOSIS — I10 Essential (primary) hypertension: Secondary | ICD-10-CM | POA: Diagnosis not present

## 2016-06-27 DIAGNOSIS — Z87891 Personal history of nicotine dependence: Secondary | ICD-10-CM | POA: Insufficient documentation

## 2016-06-27 DIAGNOSIS — K5792 Diverticulitis of intestine, part unspecified, without perforation or abscess without bleeding: Secondary | ICD-10-CM | POA: Insufficient documentation

## 2016-06-27 DIAGNOSIS — R109 Unspecified abdominal pain: Secondary | ICD-10-CM | POA: Diagnosis present

## 2016-06-27 LAB — URINALYSIS, ROUTINE W REFLEX MICROSCOPIC
Bilirubin Urine: NEGATIVE
GLUCOSE, UA: NEGATIVE mg/dL
Hgb urine dipstick: NEGATIVE
Ketones, ur: NEGATIVE mg/dL
LEUKOCYTES UA: NEGATIVE
Nitrite: NEGATIVE
PH: 6 (ref 5.0–8.0)
Protein, ur: NEGATIVE mg/dL
SPECIFIC GRAVITY, URINE: 1.02 (ref 1.005–1.030)

## 2016-06-27 LAB — COMPREHENSIVE METABOLIC PANEL
ALK PHOS: 45 U/L (ref 38–126)
ALT: 15 U/L — ABNORMAL LOW (ref 17–63)
ANION GAP: 8 (ref 5–15)
AST: 20 U/L (ref 15–41)
Albumin: 4.1 g/dL (ref 3.5–5.0)
BILIRUBIN TOTAL: 0.8 mg/dL (ref 0.3–1.2)
BUN: 32 mg/dL — ABNORMAL HIGH (ref 6–20)
CALCIUM: 10 mg/dL (ref 8.9–10.3)
CO2: 28 mmol/L (ref 22–32)
Chloride: 103 mmol/L (ref 101–111)
Creatinine, Ser: 1.33 mg/dL — ABNORMAL HIGH (ref 0.61–1.24)
GFR calc non Af Amer: 50 mL/min — ABNORMAL LOW (ref >60)
GFR, EST AFRICAN AMERICAN: 58 mL/min — AB (ref >60)
Glucose, Bld: 118 mg/dL — ABNORMAL HIGH (ref 65–99)
POTASSIUM: 4.3 mmol/L (ref 3.5–5.1)
Sodium: 139 mmol/L (ref 135–145)
TOTAL PROTEIN: 7.2 g/dL (ref 6.5–8.1)

## 2016-06-27 LAB — CBC WITH DIFFERENTIAL/PLATELET
Basophils Absolute: 0 10*3/uL (ref 0.0–0.1)
Basophils Relative: 0 %
Eosinophils Absolute: 0.3 10*3/uL (ref 0.0–0.7)
Eosinophils Relative: 4 %
HEMATOCRIT: 41.3 % (ref 39.0–52.0)
HEMOGLOBIN: 14.3 g/dL (ref 13.0–17.0)
LYMPHS PCT: 19 %
Lymphs Abs: 1.6 10*3/uL (ref 0.7–4.0)
MCH: 31.5 pg (ref 26.0–34.0)
MCHC: 34.6 g/dL (ref 30.0–36.0)
MCV: 91 fL (ref 78.0–100.0)
MONO ABS: 0.9 10*3/uL (ref 0.1–1.0)
MONOS PCT: 10 %
NEUTROS ABS: 5.8 10*3/uL (ref 1.7–7.7)
Neutrophils Relative %: 67 %
Platelets: 125 10*3/uL — ABNORMAL LOW (ref 150–400)
RBC: 4.54 MIL/uL (ref 4.22–5.81)
RDW: 12.9 % (ref 11.5–15.5)
WBC: 8.6 10*3/uL (ref 4.0–10.5)

## 2016-06-27 NOTE — ED Triage Notes (Signed)
Pt of Dr. T.(PCP),  dx'd with aortic aneurysm on 7/14, has not seen specialist yet, c/o recurrent abd pain, also reports weak, dizzy and unsteady gait, (denies: nvd, fever, bleeding or other sx), admits to some constipation. Last BM this am. Last ate 1730. Took evening meds including tylenol.

## 2016-06-27 NOTE — ED Provider Notes (Signed)
[facility] Provider Note   CSN: [id number] Arrival date & time: 06/27/16  2109  First Provider Contact:  None       History   Chief Complaint Chief Complaint  Patient presents with  . Abdominal Pain    HPI Dominic Wallace is a 75 y.o. male.  Patient is a 75 year old male with past medical history of coronary artery disease with stent, hypertension, and diverticulosis. He presents for evaluation of left lower quadrant pain which has been present and worsening over the past week. He denies any fevers or chills. He denies any bloody stool, diarrhea, or constipation. His pain is worse with movement and change in position.  He tells me he was recently diagnosed with an abnormality within his abdominal aorta. 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   The history is provided by the patient.  Abdominal Pain   This is a new problem. Episode onset: 1 week ago. The problem occurs constantly. The problem has been gradually worsening. The pain is located in the LLQ. The pain is moderate. Pertinent negatives include fever, diarrhea, melena and constipation. The symptoms are aggravated by certain positions and palpation. Nothing relieves the symptoms.    Past Medical History:  Diagnosis Date  . Hypertension     Patient Active Problem List   Diagnosis Date Noted  . Essential hypertension, benign 06/07/2016  . Hyperlipidemia 06/07/2016  . Presence of stent in coronary artery in patient with coronary artery disease 06/07/2016  . Benign paroxysmal positional vertigo 06/07/2016  . Low back pain 06/07/2016  . Annual physical exam 06/07/2016  . BPH (benign prostatic hyperplasia) 06/07/2016  . GERD (gastroesophageal reflux disease) 06/07/2016  . Insomnia 06/07/2016  . Aneurysm of abdominal aorta (HCC) 06/07/2016    History reviewed. No pertinent surgical history.     Home  Medications    Prior to Admission medications   Medication Sig Start Date End Date Taking? Authorizing Provider  acetaminophen (TYLENOL ARTHRITIS PAIN) 650 MG CR tablet Take 500 mg by mouth 2 (two) times daily. Reported on 06/11/2016    Historical Provider, MD  aspirin 81 MG chewable tablet Chew 81 mg by mouth daily.    Historical Provider, MD  azelastine (ASTELIN) 0.1 % nasal spray Place 2 sprays into both nostrils 2 (two) times daily as needed for rhinitis. Use in each nostril as directed    Historical Provider, MD  bisoprolol (ZEBETA) 5 MG tablet Take 5 mg by mouth daily.    Historical Provider, MD  clopidogrel (PLAVIX) 75 MG tablet Take 75 mg by mouth daily.    Historical Provider, MD  Cyanocobalamin (VITAMIN B 12 PO) Take 1,000 mcg by mouth daily.    Historical Provider, MD  docusate sodium (COLACE) 100 MG capsule Take 200 mg by mouth daily.    Historical Provider, MD  Dutasteride-Tamsulosin HCl 0.5-0.4 MG CAPS Take 1 tablet by mouth daily. 06/17/16   Monica Becton, MD  fluticasone (FLONASE) 50 MCG/ACT nasal spray Place 1-2 sprays into both nostrils daily as needed for allergies or rhinitis.    Historical Provider, MD  magnesium oxide (MAG-OX) 400 MG tablet Take 400 mg by mouth daily.    Historical Provider, MD  meclizine (ANTIVERT) 12.5 MG tablet Take 1 tablet (12.5 mg total) by mouth 2 (two) times daily. 06/07/16   Monica Becton, MD  Multiple Vitamins-Minerals (ONE-A-DAY 50 PLUS PO) Take 1  tablet by mouth daily.    Historical Provider, MD  nitroGLYCERIN (NITROSTAT) 0.4 MG SL tablet Place 0.4 mg under the tongue every 5 (five) minutes as needed for chest pain.    Historical Provider, MD  Omega-3 Fatty Acids (OMEGA 3 PO) Take 1,200 mg by mouth 2 (two) times daily.     Historical Provider, MD  pravastatin (PRAVACHOL) 40 MG tablet Take 1 tablet (40 mg total) by mouth daily. 06/25/16   Monica Becton, MD  predniSONE (DELTASONE) 50 MG tablet One tab PO daily for 5 days. 06/07/16    Monica Becton, MD  ranitidine (ZANTAC) 150 MG tablet Take 150 mg by mouth 2 (two) times daily.     Historical Provider, MD  solifenacin (VESICARE) 10 MG tablet Take 10 mg by mouth daily.    Historical Provider, MD  valsartan-hydrochlorothiazide (DIOVAN-HCT) 320-25 MG tablet Take 0.5 tablets by mouth daily. 06/07/16   Monica Becton, MD  zolpidem (AMBIEN) 10 MG tablet Take 5 mg by mouth at bedtime.    Historical Provider, MD    Family History History reviewed. No pertinent family history.  Social History Social History  Substance Use Topics  . Smoking status: Former Games developer  . Smokeless tobacco: Never Used  . Alcohol use No     Allergies   Other and Pollen extract   Review of Systems Review of Systems  Constitutional: Negative for fever.  Gastrointestinal: Positive for abdominal pain. Negative for constipation, diarrhea and melena.  All other systems reviewed and are negative.    Physical Exam Updated Vital Signs BP 171/87 (BP Location: Left Arm)   Pulse (!) 54   Temp 97.5 F (36.4 C) (Oral)   Resp 18   Ht 5\' 6"  (1.676 m)   Wt 154 lb (69.9 kg)   SpO2 97%   BMI 24.86 kg/m   Physical Exam  Constitutional: He is oriented to person, place, and time. He appears well-developed and well-nourished. No distress.  HENT:  Head: Normocephalic and atraumatic.  Neck: Normal range of motion. Neck supple.  Cardiovascular: Normal rate and regular rhythm.   No murmur heard. Pulmonary/Chest: Effort normal and breath sounds normal. No respiratory distress.  Abdominal: Soft. Bowel sounds are normal. He exhibits no distension and no mass. There is tenderness. There is no guarding.  There is mild tenderness to palpation in the left lower quadrant.  Musculoskeletal: Normal range of motion. He exhibits no edema.  Neurological: He is alert and oriented to person, place, and time.  Skin: Skin is warm and dry. He is not diaphoretic.  Nursing note and vitals  reviewed.    ED Treatments / Results  Labs (all labs ordered are listed, but only abnormal results are displayed) Labs Reviewed  CBC WITH DIFFERENTIAL/PLATELET - Abnormal; Notable for the following:       Result Value   Platelets 125 (*)    All other components within normal limits  COMPREHENSIVE METABOLIC PANEL - Abnormal; Notable for the following:    Glucose, Bld 118 (*)    BUN 32 (*)    Creatinine, Ser 1.33 (*)    ALT 15 (*)    GFR calc non Af Amer 50 (*)    GFR calc Af Amer 58 (*)    All other components within normal limits  URINALYSIS, ROUTINE W REFLEX MICROSCOPIC (NOT AT [facility])    EKG  EKG Interpretation None       Radiology No results found.  Procedures Procedures (including critical care time)  Medications Ordered in ED Medications - No data to display   Initial Impression / Assessment and Plan / ED Course  I have reviewed the triage vital signs and the nursing notes.  Pertinent labs & imaging results that were available during my care of the patient were reviewed by me and considered in my medical decision making (see chart for details).  Clinical Course    Workup reveals acute sigmoid diverticulitis. 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 I highly doubt this is the cause of his discomfort. He will be treated with Cipro and Flagyl for the diverticulitis and advised to return if his symptoms significantly worsen or change. His abdomen is otherwise benign and he is nontoxic-appearing. He has no fever or white count.  Final Clinical Impressions(s) / ED Diagnoses   Final diagnoses:  None    New Prescriptions New Prescriptions   No medications on file     Geoffery Lyons, MD 06/28/16 0155

## 2016-06-28 ENCOUNTER — Ambulatory Visit: Payer: Medicare Other | Admitting: Physical Therapy

## 2016-06-28 DIAGNOSIS — K5792 Diverticulitis of intestine, part unspecified, without perforation or abscess without bleeding: Secondary | ICD-10-CM | POA: Diagnosis not present

## 2016-06-28 MED ORDER — HYDROCODONE-ACETAMINOPHEN 5-325 MG PO TABS: 1.0000 | ORAL_TABLET | Freq: Four times a day (QID) | ORAL | 0 refills | Status: DC | PRN

## 2016-06-28 MED ORDER — CIPROFLOXACIN HCL 500 MG PO TABS: 500.0000 mg | ORAL_TABLET | Freq: Three times a day (TID) | ORAL | 0 refills | Status: DC

## 2016-06-28 MED ORDER — IOPAMIDOL (ISOVUE-300) INJECTION 61%
100.0000 mL | Freq: Once | INTRAVENOUS | Status: AC | PRN
  Administered 2016-06-28: 80 mL via INTRAVENOUS

## 2016-06-28 MED ORDER — METRONIDAZOLE 500 MG PO TABS: 500.0000 mg | ORAL_TABLET | Freq: Two times a day (BID) | ORAL | 0 refills | Status: DC

## 2016-06-28 NOTE — ED Notes (Signed)
Patient transported to CT 

## 2016-06-28 NOTE — Discharge Instructions (Signed)
Cipro and Flagyl as prescribed.  Hydrocodone as prescribed as needed for pain.  Return to the emergency department if you develop high fever, worsening abdominal pain, bloody stools, or other new and concerning symptoms.

## 2016-07-01 ENCOUNTER — Telehealth: Payer: Self-pay

## 2016-07-01 ENCOUNTER — Ambulatory Visit
Admission: RE | Admit: 2016-07-01 | Discharge: 2016-07-01 | Disposition: A | Discharge status: Home / Self Care | Payer: Medicare Other | Source: Ambulatory Visit | Attending: Sports Medicine | Admitting: Sports Medicine

## 2016-07-01 MED ORDER — METHYLPREDNISOLONE ACETATE 40 MG/ML INJ SUSP (RADIOLOG: 120.0000 mg | Freq: Once | INTRAMUSCULAR | Status: DC

## 2016-07-01 MED ORDER — IOPAMIDOL (ISOVUE-M 200) INJECTION 41%: 1.0000 mL | Freq: Once | INTRAMUSCULAR | Status: DC

## 2016-07-01 NOTE — Discharge Instructions (Signed)

## 2016-07-01 NOTE — Telephone Encounter (Signed)
Wife of pt left VM stating pt epidural was cancelled due to pt being diagnosed with diverticulitis in the ER.

## 2016-07-02 ENCOUNTER — Ambulatory Visit: Payer: Medicare Other | Admitting: Physical Therapy

## 2016-07-02 DIAGNOSIS — R2689 Other abnormalities of gait and mobility: Secondary | ICD-10-CM | POA: Diagnosis not present

## 2016-07-02 DIAGNOSIS — R42 Dizziness and giddiness: Secondary | ICD-10-CM

## 2016-07-02 DIAGNOSIS — R2681 Unsteadiness on feet: Secondary | ICD-10-CM

## 2016-07-02 NOTE — Patient Instructions (Addendum)
Nadine CountsBob, if resting during the day, try to keep your head up. Try using a recliner rather than lying flat.    Walking Program:  Begin walking for exercise for 8-10 minutes, 1-2 times/day,5 days/week.   Progress your walking program by adding 2 minutes to your routine each week, as tolerated. Be sure to wear good walking shoes, walk in a safe environment and only progress to your tolerance.       Gaze Stabilization: Standing Feet Apart    Place your target ("A") at eye-level on the wall. Feet shoulder width apart, keeping eyes on target ("A") on wall __5__ feet away, tilt head downslightly and move head side to side for _45___ seconds. Repeat while moving head up and down for __45__ seconds. Do __2__ sessions per day. Gaze Stabilization: Tip Card  1.Target must remain in focus, not blurry, and appear stationary while head is in motion. 2.Perform exercises with small head movements (45 to either side of midline). 3.Increase speed of head motion so long as target is in focus.  Copyright  VHI. All rights reserved.   SINGLE LIMB STANCE    Stand facing your countertop. Hands close to countertop, using for balance as needed. Stand on one leg for 15 seconds without use of hands, if safe. Practice this 10 times per day on each leg.  Tandem Walk    Walk along the length of your countertop (hand close to countertop, touching counter for balance, as needed) with heel of one foot touching toes of other foot. Practice for 2-3 minutes per day.  Feet Apart (Compliant Surface) Head Motion - Eyes Closed     Stand with your back to a corner with a stable chair in front of you. Stand on 1 pillow  with feet apart. Close eyes and move head slowly: up and down 10 times; right to left 10 times; diagonally up-right to down-left 10 times; and up-left to down-right 10 times. Repeat _2-3___ times per session.

## 2016-07-02 NOTE — Therapy (Signed)
[facility] [address] [place], [place], [address] Phone: [phone]   Fax:  [phone]  Physical Therapy Treatment  Patient Details  Name: Dominic Wallace MRN: [id number] Date of Birth: [birth year] Referring Provider: Rosalio Loud, MD  Encounter Date: 07/02/2016      PT End of Session - 07/02/16 1601    Visit Number 5   Number of Visits 9   Date for PT Re-Evaluation 07/18/16   Authorization Type BCBS Medicare primary; T[employer] secondary   Authorization Time Period G Codes required   PT Start Time 0934   PT Stop Time 1015   PT Time Calculation (min) 41 min   Activity Tolerance Patient tolerated treatment well   Behavior During Therapy [facility] for tasks assessed/performed      Past Medical History:  Diagnosis Date  . Hypertension     No past surgical history on file.  There were no vitals filed for this visit.      Subjective Assessment - 07/02/16 1533    Subjective Patient seen in ED for acute diverticulitis on 8/3. Patient states, "I've been feeling horrible. I haven't really been out of bed since I went to the hospital." Reports increased dizziness when getting OOB and when standing up.   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 No/denies                Vestibular Assessment - 07/02/16 0946      Orthostatics   BP supine (x 5 minutes) 140/79   HR supine (x 5 minutes) 45   BP standing (after 1 minute) 135/102   HR standing (after 1 minute) 50   BP standing (after 3 minutes) 110/82   HR standing (after 3 minutes) 54                 [facility] Adult PT Treatment/Exercise - 07/02/16 0001      Ambulation/Gait   Ambulation/Gait Yes   Ambulation/Gait Assistance 6: Modified independent (Device/Increase time)   Ambulation/Gait Assistance Details Ambulation x5 consecutive minutes (immediately after SciFit, no rest break)   Ambulation Distance (Feet) 500 Feet   Assistive device None   Gait Pattern Step-through pattern;Decreased stride length;Decreased arm swing - right;Decreased arm swing - left  BOS more nromalized today as compared with previous sessions     Exercises   Other Exercises  SciFit x5 minutes at 80-90 RPM's; resistance level 1.5 for initial 3 minutes, resistance 2.0 for final 2 minutes.         Vestibular Treatment/Exercise - 07/02/16 0001      Vestibular Treatment/Exercise   Vestibular Treatment Provided Gaze   Gaze Exercises X1 Viewing Horizontal;X1 Viewing Vertical     X1 Viewing Horizontal   Foot Position standing    Comments x45 seconds with cueing for increased speed of head movement     X1 Viewing Vertical   Foot Position standing   Reps 1   Comments x45 seconds            Balance Exercises - 07/02/16 1557      Balance Exercises: Standing   Standing Eyes Closed Head turns;Foam/compliant surface;Other reps (comment);Wide (BOA)  1 pillow; horizontal, vertical head turns x10 each   SLS Eyes open;Solid  surface;Intermittent upper extremity support;2 reps           PT Education - 07/02/16 1541    Education provided Yes   Education Details Reviewed/modified HEP. Walking program initiated. Recommended pt rest in upright position.   Person(s) Educated Patient;Spouse   Methods Demonstration;Handout;Explanation;Verbal cues   Comprehension Verbalized understanding;Returned demonstration          PT Short Term Goals - 06/11/16 1004      PT SHORT TERM GOAL #1   Title STG's = LTG's           PT Long Term Goals - 06/20/16 1344      PT LONG TERM GOAL #1   Title Rule out orthostatic hypotension as origin of dizziness. (Target date: 07/09/16)   Baseline Met 7/25.   Status  Achieved     PT LONG TERM GOAL #2   Title Pt will perform HEP with mod I using paper handout to maximize functional gains made in PT.  (06/29/16)   Status On-going     PT LONG TERM GOAL #3   Title Positional vertigo testing will be negative to indicate resolved BPPV.    (06/29/16)   Baseline Met 7/27.   Status Achieved     PT LONG TERM GOAL #4   Title Pt will decrease DHI score from 72 to < / = 54 to indicate significant decrease in pt-perceived disability due to dizziness. (06/29/16)   Status On-going     PT LONG TERM GOAL #5   Title Complete Berg and write appropriate LTG.  (06/29/16)   Baseline Completed 7/27 with Berg score of 50/56.   Status Achieved     Additional Long Term Goals   Additional Long Term Goals Yes     PT LONG TERM GOAL #6   Title Pt will consistently perform logroll technique with supine to/from sit to indicate improved body mechanics, decreased pain with bed mobility.    Status On-going     PT LONG TERM GOAL #7   Title Pt will participate in 30 consecutive minutes of exercise with no more than 2-point increase (on 10-point scale) in pain rating.    Status On-going     PT LONG TERM GOAL #8   Title Pt will improve Berg score from 50/56 to >/= 53/56 to indicate significant improvement in standing balance.  (   Status New               Plan - 07/02/16 1602    Clinical Impression Statement Pt arrived to session with report of dizziness having returned since last session. Pt/wife report pt has been in bed since being treated or acute diverticulitis on 8/3. Now, pt experiences dizziness with supine > sit and sit > stand. Orthostatic vital signs reveal decreased BP with transition from supine >  standing. Recommending pt rest in upright position (recliner), initiate walking program, and resume standing balance exercises.    Rehab Potential Fair   PT Frequency 2x / week   PT Duration 4 weeks   PT Treatment/Interventions ADLs/Self Care Home Management;Canalith  Repostioning;Vestibular;Gait training;Stair training;Neuromuscular re-education;Functional mobility training;Therapeutic activities;Patient/family education;Therapeutic exercise;Balance training;Manual techniques   PT Next Visit Plan Continue high level balance and dynamic gait activities. Progress HEP. Ask about walking program. Consider adding 360-degree turns to HEP.   Consulted and Agree with Plan of Care Patient;Family member/caregiver   Family Member Consulted wife, [name]      Patient will benefit from skilled therapeutic intervention in order to improve the  following deficits and impairments:  Abnormal gait, Dizziness, Decreased balance, Improper body mechanics, Impaired flexibility, Postural dysfunction, Pain  Visit Diagnosis: Other abnormalities of gait and mobility  Unsteadiness on feet  Dizziness and giddiness     Problem List Patient Active Problem List   Diagnosis Date Noted  . Essential hypertension, benign 06/07/2016  . Hyperlipidemia 06/07/2016  . Presence of stent in coronary artery in patient with coronary artery disease 06/07/2016  . Benign paroxysmal positional vertigo 06/07/2016  . Low back pain 06/07/2016  . Annual physical exam 06/07/2016  . BPH (benign prostatic hyperplasia) 06/07/2016  . GERD (gastroesophageal reflux disease) 06/07/2016  . Insomnia 06/07/2016  . Aneurysm of abdominal aorta (HCC) 06/07/2016   Billie Ruddy, PT, DPT [facility] [address] [place], [place], [address] Phone: [phone]   Fax:  [phone] 07/02/16, 4:06 PM  Name: Dominic Wallace MRN: [id number] Date of Birth: 1941/10/23

## 2016-07-03 ENCOUNTER — Encounter: Payer: Medicare Other | Admitting: Vascular Surgery

## 2016-07-04 ENCOUNTER — Ambulatory Visit: Payer: Medicare Other | Admitting: Physical Therapy

## 2016-07-04 ENCOUNTER — Encounter: Payer: Self-pay | Admitting: Vascular Surgery

## 2016-07-04 DIAGNOSIS — R2689 Other abnormalities of gait and mobility: Secondary | ICD-10-CM | POA: Diagnosis not present

## 2016-07-04 NOTE — Patient Instructions (Signed)
Nadine CountsBob, if resting during the day, try to keep your head up. Try using a recliner rather than lying flat.    Walking Program:  Continue walking for exercise for 8-10 minutes, 1-2 times/day,5 days/week.   Progress your walking program by adding 2 minutes to your routine each week, as tolerated. Be sure to wear good walking shoes, walk in a safe environment and only progress to your tolerance.       Gaze Stabilization: Standing Feet Apart    Place your target ("A") at eye-level on the wall. Feet shoulder width apart, keeping eyes on target ("A") on wall __5__ feet away, tilt head downslightly and move head side to side for _45___ seconds. Repeat while moving head up and down for __45__ seconds. Do __2__ sessions per day.  To progress this exercise: increase duration of head movement in 15-second increments (from 45 to 60 seconds, then 60 to 75 seconds), as tolerated until you're able to perform for 2 consecutive minutes without difficulty.  Then, progress by placing feet together.  Gaze Stabilization: Tip Card  1.Target must remain in focus, not blurry, and appear stationary while head is in motion. 2.Perform exercises with small head movements (45 to either side of midline). 3.Increase speed of head motionso long as target is in focus.  Copyright  VHI. All rights reserved.   SINGLE LIMB STANCE    Stand facing your countertop. Hands close to countertop, using for balance as needed. Stand on one leg for 15 seconds without use of hands, if safe. Practice this 10 times per day on each leg.  To progress this exercise: increase by 5-second increments (from 15 to 20 seconds, for example) until you're able to easily hold for 30 seconds on each leg.  Tandem Walk    Walk along the length of your countertop (hand close to countertop, touching counter for balance, as needed) with heel of one foot touching toes of other foot. Practice for 2-3 minutes per day.  Feet Apart  (Compliant Surface) Head Motion - Eyes Closed     Stand with your back to a corner with a stable chair in front of you. Stand on 1 pillowwith feet apart. Close eyes and move head slowly: up and down 10 times; right to left 10 times; diagonally up-right to down-left 10 times; and up-left to down-right 10 times. Repeat _2-3___ times per session.  To progress this exercise: perform with feet together (heels touching). When this becomes easy, progress to performing head turns on 2 pillows, starting with feet apart, then progressing to feet together, as safe. *Make sure you're standing with your back to a corner with a stable chair in front of you.

## 2016-07-04 NOTE — Therapy (Signed)
[facility] [address] [place], [place], [address] Phone: [phone]   Fax:  [phone]  Physical Therapy Treatment and Discharge Summary  Patient Details  Name: Dominic Wallace MRN: [id number] Date of Birth: Nov 04, 1941 Referring Provider: Rosalio Loud, MD  Encounter Date: 07/04/2016      PT End of Session - 07/04/16 1035    Visit Number 6   Number of Visits 9   Date for PT Re-Evaluation 07/18/16   Authorization Type BCBS Medicare primary; T[employer]are secondary   Authorization Time Period G Codes required   PT Start Time 0933   PT Stop Time 1015   PT Time Calculation (min) 42 min   Activity Tolerance Patient tolerated treatment well   Behavior During Therapy [facility] for tasks assessed/performed      Past Medical History:  Diagnosis Date  . Hypertension     No past surgical history on file.  There were no vitals filed for this visit.      Subjective Assessment - 07/04/16 0938    Subjective "My wife and I walked for about a mile yesterday.Marland KitchenMarland KitchenI didn't get dizzy or anything. It was good."   Patient is accompained by: Family member  wife, [name]   Pertinent History PMH significant for: aortic aneurysm s/p stent placement, CVA (05/2015), HTN, HLD, diverticulitis   Patient Stated Goals "I want to be healthy again and get back to doing what I was doing."   Currently in Pain? No/denies            [facility] PT Assessment - 07/04/16 0001      Berg Balance Test   Sit to Stand Able to stand without using hands and stabilize independently   Standing Unsupported Able to stand safely 2 minutes   Sitting with Back Unsupported but Feet Supported on Floor or Stool Able to sit safely and securely 2 minutes   Stand to Sit Sits safely with minimal use of hands   Transfers Able to transfer safely, minor use of hands   Standing Unsupported with Eyes Closed Able to stand 10 seconds safely   Standing Ubsupported with Feet  Together Able to place feet together independently and stand 1 minute safely   From Standing, Reach Forward with Outstretched Arm Can reach confidently >25 cm (10")   From Standing Position, Pick up Object from Floor Able to pick up shoe safely and easily   From Standing Position, Turn to Look Behind Over each Shoulder Looks behind from both sides and weight shifts well   Turn 360 Degrees Able to turn 360 degrees safely in 4 seconds or less   Standing Unsupported, Alternately Place Feet on Step/Stool Able to stand independently and safely and complete 8 steps in 20 seconds   Standing Unsupported, One Foot in Front Able to place foot tandem independently and hold 30 seconds   Standing on One Leg Able to lift leg independently and hold > 10 seconds   Total Score 56                     [facility] Adult PT Treatment/Exercise - 07/04/16 0001      Bed Mobility   Bed Mobility Sit to Supine;Supine to Sit   Supine to Sit 6: Modified independent (Device/Increase time)   Sit to Supine 6: Modified independent (Device/Increase time)     Transfers   Transfers Sit to Stand;Stand to Sit   Sit to Stand 7: Independent   Stand to Sit 7: Independent  Ambulation/Gait   Ambulation/Gait Yes   Ambulation/Gait Assistance 6: Modified independent (Device/Increase time)   Ambulation Distance (Feet) 350 Feet   Assistive device None   Gait Pattern Within Functional Limits   Ambulation Surface Level;Indoor   Ramp 6: Modified independent (Device)   Ramp Details (indicate cue type and reason) no AD   Curb 6: Modified independent (Device/increase time)   Curb Details (indicate cue type and reason) no AD                PT Education - 07/04/16 1031    Education provided Yes   Education Details Goals, findings, progress, and DC plan. Explained and provided paper handout for progression of current HEP post-DC.   Person(s) Educated Patient;Spouse   Methods Explanation;Handout   Comprehension  Verbalized understanding          PT Short Term Goals - 06/11/16 1004      PT SHORT TERM GOAL #1   Title STG's = LTG's           PT Long Term Goals - 07/04/16 8676      PT LONG TERM GOAL #1   Title Rule out orthostatic hypotension as origin of dizziness. (Target date: 07/09/16)   Baseline Met 7/25.   Status Achieved     PT LONG TERM GOAL #2   Title Pt will perform HEP with mod I using paper handout to maximize functional gains made in PT.  (06/29/16)   Status Achieved     PT LONG TERM GOAL #3   Title Positional vertigo testing will be negative to indicate resolved BPPV.    (06/29/16)   Baseline Met 7/27.   Status Achieved     PT LONG TERM GOAL #4   Title Pt will decrease DHI score from 72 to < / = 54 to indicate significant decrease in pt-perceived disability due to dizziness. (06/29/16)   Baseline 8/10: DHI = 4   Status On-going     PT LONG TERM GOAL #5   Title Complete Berg and write appropriate LTG.  (06/29/16)   Baseline Completed 7/27 with Berg score of 50/56.   Status Achieved     PT LONG TERM GOAL #6   Title Pt will consistently perform logroll technique with supine to/from sit to indicate improved body mechanics, decreased pain with bed mobility.    Baseline Met 8/10.   Status Achieved     PT LONG TERM GOAL #7   Title Pt will participate in 30 consecutive minutes of exercise with no more than 2-point increase (on 10-point scale) in pain rating.    Baseline Met 8/10.   Status Achieved     PT LONG TERM GOAL #8   Title Pt will improve Berg score from 50/56 to >/= 53/56 to indicate significant improvement in standing balance.  (   Baseline 8/10: Berg score = 56/56.   Status Achieved               Plan - 07/04/16 1036    Clinical Impression Statement Patient has met all goals and reports no presence of dizziness or backpain. Pt attributes back pain to underlying diverticulitis (for which pt was treated on 8/3). Pt score 56/56 on Berg and has initiated  walking program. Pt will be discharged from outpatient PT at this time. Explained and provided paper handout for progression of current HPE post-DC. Pt/wife verbalized understanding and were in full agreement.    Consulted and Agree with Plan of Care Patient;Family member/caregiver  Family Member Consulted wife, [name]      Patient will benefit from skilled therapeutic intervention in order to improve the following deficits and impairments:     Visit Diagnosis: Other abnormalities of gait and mobility       G-Codes - Aug 02, 2016 1031    Functional Assessment Tool Used DHI = 4   Functional Limitation Self care   Self Care Goal Status (V5643) At least 40 percent but less than 60 percent impaired, limited or restricted   Self Care Discharge Status [id number]) At least 1 percent but less than 20 percent impaired, limited or restricted      Problem List Patient Active Problem List   Diagnosis Date Noted  . Essential hypertension, benign 06/07/2016  . Hyperlipidemia 06/07/2016  . Presence of stent in coronary artery in patient with coronary artery disease 06/07/2016  . Benign paroxysmal positional vertigo 06/07/2016  . Low back pain 06/07/2016  . Annual physical exam 06/07/2016  . BPH (benign prostatic hyperplasia) 06/07/2016  . GERD (gastroesophageal reflux disease) 06/07/2016  . Insomnia 06/07/2016  . Aneurysm of abdominal aorta (HCC) 06/07/2016   PHYSICAL THERAPY DISCHARGE SUMMARY  Visits from Start of Care: 6  Current functional level related to goals / functional outcomes: See above goals and outcomes.   Remaining deficits: See above. Pt reports no further dizziness and demonstrates no significant instability at this time.   Education / Equipment: See above. HEP and progression.  Plan: Patient agrees to discharge.  Patient goals were met. Patient is being discharged due to meeting the stated rehab goals.  ?????        Billie Ruddy, PT, DPT [facility] [address] [place], [place], [address] Phone: [phone]   Fax:  [phone] 02-Aug-2016, 10:38 AM  Name: Dominic Wallace MRN: [id number] Date of Birth: [birth year]

## 2016-07-09 ENCOUNTER — Encounter: Payer: Medicare Other | Admitting: Physical Therapy

## 2016-07-09 ENCOUNTER — Ambulatory Visit (INDEPENDENT_AMBULATORY_CARE_PROVIDER_SITE_OTHER): Payer: Medicare Other | Admitting: Vascular Surgery

## 2016-07-09 ENCOUNTER — Encounter: Payer: Self-pay | Admitting: Vascular Surgery

## 2016-07-09 VITALS — BP 177/102 | HR 52 | Ht 66.0 in | Wt 160.4 lb

## 2016-07-09 DIAGNOSIS — I7121 Aneurysm of the ascending aorta, without rupture: Secondary | ICD-10-CM

## 2016-07-09 DIAGNOSIS — I723 Aneurysm of iliac artery: Secondary | ICD-10-CM | POA: Diagnosis not present

## 2016-07-09 DIAGNOSIS — I712 Thoracic aortic aneurysm, without rupture: Secondary | ICD-10-CM | POA: Diagnosis not present

## 2016-07-09 NOTE — Addendum Note (Signed)
Addended by: Sharee PimpleMCCHESNEY, Ophelia Sipe K on: 07/09/2016 11:52 AM   Modules accepted: Orders

## 2016-07-09 NOTE — Progress Notes (Signed)
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 male, in today for discussion of BX artery aneurysm seen on CT on 06/10/2016. Patient had reported with abdominal pain his left abdomen most likely related to diverticulitis. He underwent CT scan and had incidental finding of a 1.6 cm partially threaded most iliac aneurysm internal iliac artery on the left. His abdominal discomfort has resolved. He is seen today for further discussion of this. Also on the CT he was found to have a 5 cm ascending arch aneurysm. In discussing this with the patient and his wife, they apparently have not been told of this diagnosis as well. He does not have a family history of aneurysm. Is relocating here from [place] to help care for his wife's mother. Has been relatively healthy. Does have a history of prior coronary stenting with no active coronary disease.  Past Medical History:  Diagnosis Date  . Hypertension     History reviewed. No pertinent family history.  SOCIAL HISTORY: Social History   Social History  . Marital status: Married    Spouse name: N/A  . Number of children: N/A  . Years of education: N/A   Occupational History  . Not on file.   Social History Main Topics  . Smoking status: Former Games developermoker  . Smokeless tobacco: Never Used  . Alcohol use No  . Drug use: No  . Sexual activity: Not on file   Other Topics Concern  . Not on file   Social History Narrative  . No narrative on file    Allergies  Allergen Reactions  . Other     Per pt cat hair & pollen...reaction is runny nose, sneezing, itchy eyes  . Pollen Extract     Current Outpatient Prescriptions  Medication Sig Dispense Refill  . acetaminophen (TYLENOL ARTHRITIS PAIN) 650 MG CR tablet Take 500 mg by mouth 2 (two) times daily. Reported on 06/11/2016    . aspirin 81 MG chewable  tablet Chew 81 mg by mouth daily.    Marland Kitchen. azelastine (ASTELIN) 0.1 % nasal spray Place 2 sprays into both nostrils 2 (two) times daily as needed for rhinitis. Use in each nostril as directed    . bisoprolol (ZEBETA) 5 MG tablet Take 5 mg by mouth daily.    . clopidogrel (PLAVIX) 75 MG tablet Take 75 mg by mouth daily.    . Cyanocobalamin (VITAMIN B 12 PO) Take 1,000 mcg by mouth daily.    Marland Kitchen. docusate sodium (COLACE) 100 MG capsule Take 200 mg by mouth daily.    . Dutasteride-Tamsulosin HCl 0.5-0.4 MG CAPS Take 1 tablet by mouth daily. 90 capsule 3  . fluticasone (FLONASE) 50 MCG/ACT nasal spray Place 1-2 sprays into both nostrils daily as needed for allergies or rhinitis.    . magnesium oxide (MAG-OX) 400 MG tablet Take 400 mg by mouth daily.    . meclizine (ANTIVERT) 12.5 MG tablet Take 1 tablet (12.5 mg total) by mouth 2 (two) times daily. 60 tablet 11  . Multiple Vitamins-Minerals (ONE-A-DAY 50 PLUS PO) Take 1 tablet by mouth daily.    . nitroGLYCERIN (NITROSTAT) 0.4 MG SL tablet Place 0.4 mg under the tongue every 5 (five) minutes as needed for chest pain.    . Omega-3 Fatty Acids (OMEGA 3 PO) Take 1,200 mg by mouth 2 (two) times daily.     .Marland Kitchen  pravastatin (PRAVACHOL) 40 MG tablet Take 1 tablet (40 mg total) by mouth daily. 90 tablet 3  . ranitidine (ZANTAC) 150 MG tablet Take 150 mg by mouth 2 (two) times daily.     . solifenacin (VESICARE) 10 MG tablet Take 10 mg by mouth daily.    . valsartan-hydrochlorothiazide (DIOVAN-HCT) 320-25 MG tablet Take 0.5 tablets by mouth daily. 30 tablet 3  . zolpidem (AMBIEN) 10 MG tablet Take 5 mg by mouth at bedtime.    . ciprofloxacin (CIPRO) 500 MG tablet Take 1 tablet (500 mg total) by mouth 3 (three) times daily. (Patient not taking: Reported on 07/09/2016) 21 tablet 0  . HYDROcodone-acetaminophen (NORCO) 5-325 MG tablet Take 1-2 tablets by mouth every 6 (six) hours as needed. (Patient not taking: Reported on 07/09/2016) 15 tablet 0  . metroNIDAZOLE (FLAGYL)  500 MG tablet Take 1 tablet (500 mg total) by mouth 2 (two) times daily. (Patient not taking: Reported on 07/09/2016) 14 tablet 0  . predniSONE (DELTASONE) 50 MG tablet One tab PO daily for 5 days. (Patient not taking: Reported on 07/09/2016) 5 tablet 0   No current facility-administered medications for this visit.     REVIEW OF SYSTEMS:  [X]  denotes positive finding, [ ]  denotes negative finding Cardiac  Comments:  Chest pain or chest pressure:    Shortness of breath upon exertion:    Short of breath when lying flat:    Irregular heart rhythm:        Vascular    Pain in calf, thigh, or hip brought on by ambulation:    Pain in feet at night that wakes you up from your sleep:     Blood clot in your veins:    Leg swelling:         Pulmonary    Oxygen at home:    Productive cough:     Wheezing:         Neurologic    Sudden weakness in arms or legs:     Sudden numbness in arms or legs:     Sudden onset of difficulty speaking or slurred speech:    Temporary loss of vision in one eye:     Problems with dizziness:         Gastrointestinal    Blood in stool:     Vomited blood:         Genitourinary    Burning when urinating:     Blood in urine:        Psychiatric    Major depression:         Hematologic    Bleeding problems:    Problems with blood clotting too easily:        Skin    Rashes or ulcers:        Constitutional    Fever or chills:      PHYSICAL EXAM: Vitals:   07/09/16 0811 07/09/16 0812  BP: (!) 176/100 (!) 177/102  Pulse: (!) 52   SpO2: 100%   Weight: 160 lb 6.4 oz (72.8 kg)   Height: 5\' 6"  (1.676 m)     GENERAL: The patient is a well-nourished male, in no acute distress. The vital signs are documented above. CARDIAC: There is a regular rate and rhythm.  VASCULAR: 2+ radial 2+ femoral 2-3+ popliteal and 2+ posterior tibial pulses bilaterally. PULMONARY: There is good air exchange bilaterally without wheezing or rales. ABDOMEN: Soft and non-tender  with normal pitched bowel sounds. No aneurysm palpable MUSCULOSKELETAL: There  are no major deformities or cyanosis. NEUROLOGIC: No focal weakness or paresthesias are detected. SKIN: There are no ulcers or rashes noted. PSYCHIATRIC: The patient has a normal affect.  DATA:   I reviewed his CT scan films and discussed these with the patient and his wife present. 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 He does have diffuse arteriomegaly throughout his exam. Does have a maximal diameter of 2.9 cm of his infrarenal aorta.  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 This was on the upper cuts of his abdominal and pelvic CT and he does not have a dedicated chest CT.  MEDICAL ISSUES:  Had an extensive discussion with the patient and his wife regarding his iliac artery aneurysm. Explained that this is quite small and poses essentially no risk for rupture at his current size. Did suggest serial follow-up with this. Explained that due to its location the pelvis that ultrasound was poor in predicting size of this and so therefore would recommend CT scan in one year to rule out any change in the configuration of this. Did explain symptoms of leaking aneurysm but explained that this would be highly unlikely with the small size of this. I am concerned regarding his 5 cm ascending aortic dilatation. We have taken the liberty of referring him to TCT S for further evaluation and recommendations regarding this. We will see him again in one year. He has he is asking about any limitations regarding traveling back and forth to [place] and I explained that there would be none related to his internal iliac artery aneurysm.   Gretta BeganEarly, [facility] [phone]

## 2016-07-11 ENCOUNTER — Encounter: Payer: Medicare Other | Admitting: Physical Therapy

## 2016-07-17 ENCOUNTER — Encounter: Payer: Self-pay | Admitting: Sports Medicine

## 2016-07-17 ENCOUNTER — Ambulatory Visit (INDEPENDENT_AMBULATORY_CARE_PROVIDER_SITE_OTHER): Payer: Medicare Other | Admitting: Sports Medicine

## 2016-07-17 DIAGNOSIS — M545 Low back pain, unspecified: Secondary | ICD-10-CM

## 2016-07-17 DIAGNOSIS — I10 Essential (primary) hypertension: Secondary | ICD-10-CM

## 2016-07-17 NOTE — Assessment & Plan Note (Signed)
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 He does have follow-up with them but he is agreeable to proceed with the epidural, facet joints conservative as future interventional targets if needed.

## 2016-07-17 NOTE — Progress Notes (Signed)
  Subjective:    CC: Follow-up  HPI: Thoracic and iliac aneurysms: Followed by thoracic and vascular surgery.  Lumbar spondylosis: Axial and discogenic back pain, has not yet had his epidural.  Hypertension: Well controlled  Past medical history, Surgical history, Family history not pertinant except as noted below, Social history, Allergies, and medications have been entered into the medical record, reviewed, and no changes needed.   Review of Systems: No fevers, chills, night sweats, weight loss, chest pain, or shortness of breath.   Objective:    General: Well Developed, well nourished, and in no acute distress.  Neuro: Alert and oriented x3, extra-ocular muscles intact, sensation grossly intact.  HEENT: Normocephalic, atraumatic, pupils equal round reactive to light, neck supple, no masses, no lymphadenopathy, thyroid nonpalpable.  Skin: Warm and dry, no rashes. Cardiac: Regular rate and rhythm, no murmurs rubs or gallops, no lower extremity edema.  Respiratory: Clear to auscultation bilaterally. Not using accessory muscles, speaking in full sentences.  Impression and Recommendations:    Essential hypertension, benign Well-controlled.  Low back pain MRI showed spinal stenosis which was multifactorial at L3-L4 and L4-L5 with several levels of facet arthritis, has pain was predominantly left-sided, axial, and discogenic we were going to proceed with a left L4-L5 interlaminar epidural, this was canceled, we found an iliac and thoracic uric aneurysm, so he needed to touch base with vascular and thoracic surgery. He does have follow-up with them but he is agreeable to proceed with the epidural, facet joints conservative as future interventional targets if needed.  I spent 25 minutes with this patient, greater than 50% was face-to-face time counseling regarding the above diagnoses

## 2016-07-17 NOTE — Assessment & Plan Note (Signed)
Well controlled 

## 2016-07-23 ENCOUNTER — Other Ambulatory Visit: Payer: Self-pay | Admitting: *Deleted

## 2016-07-23 DIAGNOSIS — I712 Thoracic aortic aneurysm, without rupture: Secondary | ICD-10-CM

## 2016-07-23 DIAGNOSIS — I714 Abdominal aortic aneurysm, without rupture, unspecified: Secondary | ICD-10-CM

## 2016-07-23 DIAGNOSIS — I7121 Aneurysm of the ascending aorta, without rupture: Secondary | ICD-10-CM

## 2016-07-24 ENCOUNTER — Other Ambulatory Visit: Payer: Self-pay | Admitting: Surgery

## 2016-07-24 ENCOUNTER — Institutional Professional Consult (permissible substitution) (INDEPENDENT_AMBULATORY_CARE_PROVIDER_SITE_OTHER): Payer: Medicare Other | Admitting: Surgery

## 2016-07-24 ENCOUNTER — Encounter: Payer: Self-pay | Admitting: Surgery

## 2016-07-24 VITALS — BP 122/78 | HR 45 | Resp 20 | Ht 66.0 in | Wt 156.0 lb

## 2016-07-24 DIAGNOSIS — I7121 Aneurysm of the ascending aorta, without rupture: Secondary | ICD-10-CM

## 2016-07-24 DIAGNOSIS — I712 Thoracic aortic aneurysm, without rupture: Secondary | ICD-10-CM

## 2016-07-26 ENCOUNTER — Encounter: Payer: Self-pay | Admitting: Sports Medicine

## 2016-07-26 ENCOUNTER — Encounter: Payer: Self-pay | Admitting: Surgery

## 2016-07-26 NOTE — Progress Notes (Signed)
Cardiothoracic Surgery Consultation  PCP is Rodney Langton, MD Referring Provider is Early, Kristen Loader, MD  Chief Complaint  Patient presents with  . Thoracic Aortic Aneurysm    Surgical eval, 5 cm aneurysmal dilatation of the ascending aorta seen on CT ABD/Pelvis from 06/27/2016    HPI:  The patient is a 75 year old gentleman who used to live in [place] but moved to [place]. He is back caring for his [place]in-[place] and is planning to move back to [place]. He was recently evaluated for some abdominal pain and had a CT of the abdomen on 06/10/2016 which showed a 1.6 cm partially thrombosed left internal iliac aneurysm as well as a 2.1 cm well-circumscribed fat containing nodule at the right lung base consistent with a hamartoma. There was sigmoid diverticulosis but no signs of inflammation. He had a follow up scan on 06/28/2016 due to persistent symptoms which showed focal inflammatory changes in the distal descending/sigmoid junction compatible with diverticulitis. The scan also showed a 5 cm aortic root but the scan only shows the aortic root and distal descending aorta which was normal sized. He has also had some lower back pain and had an MRI of the lumbar spine showing degenerative disease. He was referred to Dr. Arbie Cookey for the iliac aneurysm and he referred the patient to me for evaluation of the aortic root aneurysm.  Past Medical History:  Diagnosis Date  . Hypertension   Borderline DM  Past Surgical Hx: prior coronary stent.  Family Hx: no history of aneurysm disease or heart disease. His mother, brother and sister have HTN.  Social History Social History  Substance Use Topics  . Smoking status: Former Games developer  . Smokeless tobacco: Never Used  . Alcohol use No    Current Outpatient Prescriptions  Medication Sig Dispense Refill  . aspirin 81 MG chewable tablet Chew 81 mg by mouth daily.    Marland Kitchen azelastine (ASTELIN) 0.1 % nasal spray Place 2 sprays into both nostrils 2  (two) times daily as needed for rhinitis. Use in each nostril as directed    . bisoprolol (ZEBETA) 5 MG tablet Take 5 mg by mouth daily.    . clopidogrel (PLAVIX) 75 MG tablet Take 75 mg by mouth daily.    . Cyanocobalamin (VITAMIN B 12 PO) Take 1,000 mcg by mouth daily.    Marland Kitchen docusate sodium (COLACE) 100 MG capsule Take 200 mg by mouth daily.    . Dutasteride-Tamsulosin HCl 0.5-0.4 MG CAPS Take 1 tablet by mouth daily. 90 capsule 3  . fluticasone (FLONASE) 50 MCG/ACT nasal spray Place 1-2 sprays into both nostrils daily as needed for allergies or rhinitis.    . magnesium oxide (MAG-OX) 400 MG tablet Take 400 mg by mouth daily.    . meclizine (ANTIVERT) 12.5 MG tablet Take 1 tablet (12.5 mg total) by mouth 2 (two) times daily. 60 tablet 11  . Multiple Vitamins-Minerals (ONE-A-DAY 50 PLUS PO) Take 1 tablet by mouth daily.    . nitroGLYCERIN (NITROSTAT) 0.4 MG SL tablet Place 0.4 mg under the tongue every 5 (five) minutes as needed for chest pain.    . Omega-3 Fatty Acids (OMEGA 3 PO) Take 1,200 mg by mouth 2 (two) times daily.     . pravastatin (PRAVACHOL) 40 MG tablet Take 1 tablet (40 mg total) by mouth daily. 90 tablet 3  . ranitidine (ZANTAC) 150 MG tablet Take 150 mg by mouth 2 (two) times daily.     . solifenacin (VESICARE) 10  MG tablet Take 10 mg by mouth daily.    . valsartan-hydrochlorothiazide (DIOVAN-HCT) 320-25 MG tablet Take 0.5 tablets by mouth daily. 30 tablet 3  . zolpidem (AMBIEN) 10 MG tablet Take 5 mg by mouth at bedtime.     No current facility-administered medications for this visit.     Allergies  Allergen Reactions  . Other     Per pt cat hair & pollen...reaction is runny nose, sneezing, itchy eyes  . Pollen Extract     Review of Systems  Constitutional: Positive for unexpected weight change. Negative for activity change, appetite change, fatigue and fever.       Lost 80 lbs over one year watching diet closely  Eyes: Negative.   Respiratory: Negative.     Cardiovascular: Negative for chest pain, palpitations and leg swelling.  Gastrointestinal: Positive for diarrhea.  Endocrine: Negative.   Genitourinary: Negative.   Musculoskeletal: Negative.   Skin: Negative.   Allergic/Immunologic: Negative.   Neurological: Positive for dizziness and headaches.  Hematological: Bruises/bleeds easily.  Psychiatric/Behavioral: Negative.     BP 122/78 (BP Location: Right Arm, Patient Position: Sitting, Cuff Size: Small)   Pulse (!) 45   Resp 20   Ht 5\' 6"  (1.676 m)   Wt 156 lb (70.8 kg)   SpO2 98% Comment: RA  BMI 25.18 kg/m  Physical Exam  Constitutional: He is oriented to person, place, and time. He appears well-developed and well-nourished. No distress.  HENT:  Head: Normocephalic and atraumatic.  Mouth/Throat: Oropharynx is clear and moist.  Eyes: Conjunctivae and EOM are normal. Pupils are equal, round, and reactive to light.  Neck: Normal range of motion. Neck supple. No JVD present. No thyromegaly present.  Cardiovascular: Normal rate, regular rhythm, normal heart sounds and intact distal pulses.   No murmur heard. Pulmonary/Chest: Effort normal and breath sounds normal. No respiratory distress. He has no wheezes. He has no rales.  Abdominal: Soft. Bowel sounds are normal. He exhibits no distension and no mass. There is no tenderness.  Musculoskeletal: Normal range of motion. He exhibits no edema.  Lymphadenopathy:    He has no cervical adenopathy.  Neurological: He is alert and oriented to person, place, and time. He has normal strength. No cranial nerve deficit or sensory deficit.  Skin: Skin is warm and dry.  Psychiatric: He has a normal mood and affect.     Diagnostic Tests:  Study Result   CLINICAL DATA:  75 year old male with lower abdominal pain.  EXAM: CT ABDOMEN AND PELVIS WITH CONTRAST  TECHNIQUE: Multidetector CT imaging of the abdomen and pelvis was performed using the standard protocol following bolus  administration of intravenous contrast.  CONTRAST:  80mL ISOVUE-300 IOPAMIDOL (ISOVUE-300) INJECTION 61%  COMPARISON:  CT dated 06/07/2016  FINDINGS: There is a stable appearing 2 cm fat density nodule in the right lower lobe most compatible with a hamartoma. The visualized lung bases are otherwise clear. There is coronary vascular calcification. There is a 5 cm aneurysmal dilatation of the visualized ascending aorta.  No intra-abdominal free air or free fluid.  Probable fatty infiltration of the liver. There is mild irregularity of the hepatic contour. The gallbladder, pancreas, spleen, and the adrenal glands appear unremarkable. There is mild irregularity of the renal contours. Small bilateral renal hypodensities measuring up to 17 mm in the interpolar aspect of the right kidney noted. The larger lesions measure cyst and the smaller lesions are too small to characterize. There is no hydronephrosis on either side. The visualized ureters and  urinary bladder appear unremarkable. The prostate and seminal vesicles are grossly unremarkable.  There is sigmoid diverticulosis with muscular hypertrophy. There is focal inflammatory changes of the distal descending/sigmoid junction compatible with diverticulitis. There is no evidence of diverticular perforation or abscess. Copious amount of stool noted throughout the colon. There is no evidence of bowel obstruction. Normal appendix.  There is moderate aortoiliac atherosclerotic disease. The aorta is tortuous. The abdominal aorta measures up to 2.9 cm in diameter. There is atherosclerotic calcification of the origin of the celiac axis and renal artery ostia. The origins of the celiac axis, SMA, IMA as well as the origins of the renal arteries remain patent. No portal venous gas identified. There is no adenopathy.  Small fat containing bilateral inguinal hernias. The abdominal wall soft tissues are otherwise unremarkable. There is  osteopenia with degenerative changes of the spine. No acute fracture.  IMPRESSION: Sigmoid diverticulitis.  No abscess.  Constipation.  No bowel obstruction.  Normal appendix.  A 5 cm aneurysmal dilatation of the ascending aorta. Ascending thoracic aortic aneurysm. Recommend semi-annual imaging followup by CTA or MRA and referral to cardiothoracic surgery if not already obtained. This recommendation follows 2010 ACCF/AHA/AATS/ACR/ASA/SCA/SCAI/SIR/STS/SVM Guidelines for the Diagnosis and Management of Patients With Thoracic Aortic Disease. Circulation. 2010; 121: W098-J191  The abdominal aorta measures up to 2.9 cm in diameter. No dissection.  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   Electronically Signed   By: Elgie Collard M.D.   On: 06/28/2016 01:38     Impression:  I have personally reviewed and interpreted his CT scan of the abdomen. He has a 5 cm fusiform aortic root and proximal ascending aortic aneurysm. Unfortunately the scan does not show the entire thoracic aorta. I think he needs to have dedicated CT scan of the chest to evaluate this further. He has significant atherosclerosis and an internal iliac artery aneurysm. I reviewed the CT scan with him and his wife and answered their questions. We reviewed the pathology and treatment of thoracic aortic aneurysms.   Plan:  He will have a CTA of the chest and will then return to see me to discuss the results.  I spent 60 minutes performing this consultation and > 50% of this time was spent face to face counseling and coordinating the care of this patient's aortic root and ascending aortic aneurysm.  Alleen Borne, MD [facility] [phone]

## 2016-07-31 ENCOUNTER — Ambulatory Visit: Payer: Medicare Other | Admitting: Surgery

## 2016-08-06 ENCOUNTER — Ambulatory Visit
Admission: RE | Admit: 2016-08-06 | Discharge: 2016-08-06 | Disposition: A | Discharge status: Home / Self Care | Payer: Medicare Other | Source: Ambulatory Visit | Attending: Surgery | Admitting: Surgery

## 2016-08-06 ENCOUNTER — Ambulatory Visit: Payer: Medicare Other | Admitting: Surgery

## 2016-08-06 DIAGNOSIS — I7121 Aneurysm of the ascending aorta, without rupture: Secondary | ICD-10-CM

## 2016-08-06 DIAGNOSIS — I712 Thoracic aortic aneurysm, without rupture: Secondary | ICD-10-CM

## 2016-08-06 MED ORDER — IOPAMIDOL (ISOVUE-370) INJECTION 76%
75.0000 mL | Freq: Once | INTRAVENOUS | Status: AC | PRN
  Administered 2016-08-06: 75 mL via INTRAVENOUS

## 2016-08-07 ENCOUNTER — Encounter: Payer: Self-pay | Admitting: Surgery

## 2016-08-07 ENCOUNTER — Ambulatory Visit (INDEPENDENT_AMBULATORY_CARE_PROVIDER_SITE_OTHER): Payer: Medicare Other | Admitting: Surgery

## 2016-08-07 VITALS — BP 116/72 | HR 49 | Resp 16 | Ht 66.0 in | Wt 156.0 lb

## 2016-08-07 DIAGNOSIS — I712 Thoracic aortic aneurysm, without rupture: Secondary | ICD-10-CM

## 2016-08-07 DIAGNOSIS — I7121 Aneurysm of the ascending aorta, without rupture: Secondary | ICD-10-CM

## 2016-08-07 NOTE — Progress Notes (Signed)
HPI:  The patient returns today to discuss the results of his chest CTA done to evaluate an ascending aortic aneurysm. He has had no new complaints since I last saw him. No chest pain or shortness of breath.  Current Outpatient Prescriptions  Medication Sig Dispense Refill  . aspirin 81 MG chewable tablet Chew 81 mg by mouth daily.    Marland Kitchen. azelastine (ASTELIN) 0.1 % nasal spray Place 2 sprays into both nostrils 2 (two) times daily as needed for rhinitis. Use in each nostril as directed    . bisoprolol (ZEBETA) 5 MG tablet Take 5 mg by mouth daily.    . clopidogrel (PLAVIX) 75 MG tablet Take 75 mg by mouth daily.    . Cyanocobalamin (VITAMIN B 12 PO) Take 1,000 mcg by mouth daily.    Marland Kitchen. docusate sodium (COLACE) 100 MG capsule Take 200 mg by mouth daily.    . Dutasteride-Tamsulosin HCl 0.5-0.4 MG CAPS Take 1 tablet by mouth daily. 90 capsule 3  . fluticasone (FLONASE) 50 MCG/ACT nasal spray Place 1-2 sprays into both nostrils daily as needed for allergies or rhinitis.    . magnesium oxide (MAG-OX) 400 MG tablet Take 400 mg by mouth daily.    . meclizine (ANTIVERT) 12.5 MG tablet Take 1 tablet (12.5 mg total) by mouth 2 (two) times daily. 60 tablet 11  . Multiple Vitamins-Minerals (ONE-A-DAY 50 PLUS PO) Take 1 tablet by mouth daily.    . nitroGLYCERIN (NITROSTAT) 0.4 MG SL tablet Place 0.4 mg under the tongue every 5 (five) minutes as needed for chest pain.    . Omega-3 Fatty Acids (OMEGA 3 PO) Take 1,200 mg by mouth 2 (two) times daily.     . pravastatin (PRAVACHOL) 40 MG tablet Take 1 tablet (40 mg total) by mouth daily. 90 tablet 3  . ranitidine (ZANTAC) 150 MG tablet Take 150 mg by mouth 2 (two) times daily.     . solifenacin (VESICARE) 10 MG tablet Take 10 mg by mouth daily.    . valsartan-hydrochlorothiazide (DIOVAN-HCT) 320-25 MG tablet Take 0.5 tablets by mouth daily. 30 tablet 3  . zolpidem (AMBIEN) 10 MG tablet Take 5 mg by mouth at bedtime.     No current facility-administered  medications for this visit.      Physical Exam: BP 116/72   Pulse (!) 49   Resp 16   Ht 5\' 6"  (1.676 m)   Wt 156 lb (70.8 kg)   SpO2 98% Comment: ON RA  BMI 25.18 kg/m  He looks well Cardiac exam shows a regular rate and rhythm with normal heart sounds. There is no murmur. Lungs are clear  Diagnostic Tests:  CLINICAL DATA:  75 year old male with history and ascending thoracic aortic aneurysm. Followup study.  EXAM: CT ANGIOGRAPHY CHEST WITH CONTRAST  TECHNIQUE: Multidetector CT imaging of the chest was performed using the standard protocol during bolus administration of intravenous contrast. Multiplanar CT image reconstructions and MIPs were obtained to evaluate the vascular anatomy.  CONTRAST:  75 mL of Isovue 370.  COMPARISON:  No priors.  FINDINGS: Cardiovascular: Heart size is normal. There is no significant pericardial fluid, thickening or pericardial calcification. There is aortic atherosclerosis, as well as atherosclerosis of the great vessels of the mediastinum and the coronary arteries, including calcified atherosclerotic plaque in the left anterior descending, left circumflex and right coronary arteries. Aneurysmal dilatation of the ascending thoracic aorta which measures up to 5.0 cm in diameter. Mid aortic arch is ectatic measuring  3.7 cm in diameter. Descending thoracic aorta measures 3.2 cm in diameter. No dissection of the thoracic aorta or great vessels of the mediastinum is noted.  Mediastinum/Nodes: No pathologically enlarged mediastinal or hilar lymph nodes. Esophagus is unremarkable in appearance. No axillary lymphadenopathy.  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 No other suspicious appearing pulmonary nodules or masses are noted. Patchy areas of mild bronchial wall thickening. There is no acute consolidative airspace disease, and no pleural effusions. Mild to moderate centrilobular and mild paraseptal emphysema.  Upper Abdomen: Aortic atherosclerosis.  Musculoskeletal: There are no aggressive appearing lytic or blastic lesions noted in the visualized portions of the skeleton.  Review of the MIP images confirms the above findings.  IMPRESSION: 1. 5 cm ascending thoracic aortic aneurysm. Ascending thoracic aortic aneurysm. Recommend semi-annual imaging followup by CTA or MRA. This recommendation follows 2010 ACCF/AHA/AATS/ACR/ASA/SCA/SCAI/SIR/STS/SVM Guidelines for the Diagnosis and Management of Patients With Thoracic Aortic Disease. Circulation. 2010; 121: Z610-R604. 2. Aortic atherosclerosis, in addition to three-vessel coronary artery disease. Assessment for potential risk factor modification, dietary therapy or pharmacologic therapy may be warranted, if clinically indicated. 3. 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 This has been stable compared to prior study from 06/07/2016. Continued attention at time of repeat CTA is recommended (as no more remote prior studies are otherwise available for comparison). 4. Mild diffuse bronchial wall thickening with mild to moderate centrilobular and mild paraseptal emphysema; imaging findings suggestive of underlying COPD.   Electronically Signed   By: Trudie Reed M.D.   On: 08/06/2016 15:30  Impression:  I have personally reviewed and interpreted his CTA of the chest. He has a 5.0 cm fusiform ascending aortic aneurysm that starts at the STJ and ends just proximal to the innominate artery. The descending aorta is about 3.2 cm and tortuous.  This is still below the threshold for recommending surgical therapy at 5.5 cm and since he is 75 years old I think we should continue to follow this for now with a repeat CTA of the chest in 6 months. He has three vessel atherosclerotic coronary artery disease on CT but that does not necessarily mean that he has significant coronary obstruction and he is asymptomatic. 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 This can be followed up on the CTA of the chest in 6 months. I reviewed the scans with the patient and his wife and answered their questions. They are planning to return home to [place] soon but will return for a follow up appt in 6 months since they anticipate being back in [place] by then.  Plan:  I will see him in 6 months with a CTA of the chest.   Alleen Borne, MD [facility] [phone]

## 2016-09-13 IMAGING — CT CT ANGIO CHEST
2 of 5 series · 8 of 30 positions shown · IV contrast (75CC ISOVUE 300)
Comparison: No priors.

CLINICAL DATA: 75-year-old male with history and ascending thoracic
aortic aneurysm. Followup study.

EXAM:
CT ANGIOGRAPHY CHEST WITH CONTRAST
TECHNIQUE: Multidetector CT imaging of the chest was performed using the
standard protocol during bolus administration of intravenous
contrast. Multiplanar CT image reconstructions and MIPs were
obtained to evaluate the vascular anatomy.
CONTRAST:  75 mL of Isovue 370.

[Series 4: angio · axial · 0.70mm/px · z∈[-170,-30]mm · 3 of 113 slices shown]
[im 29/113  lung]
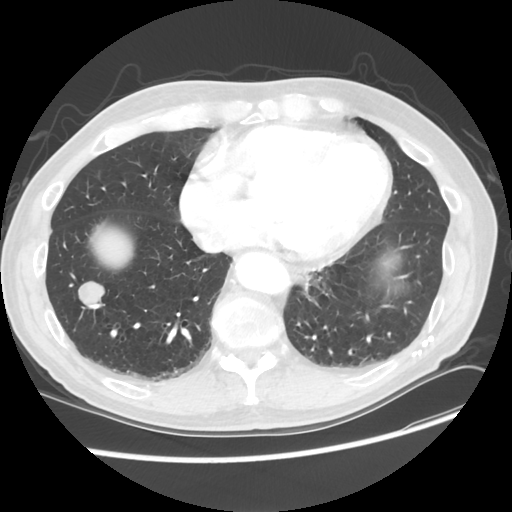
[im 57/113  mediastinal]
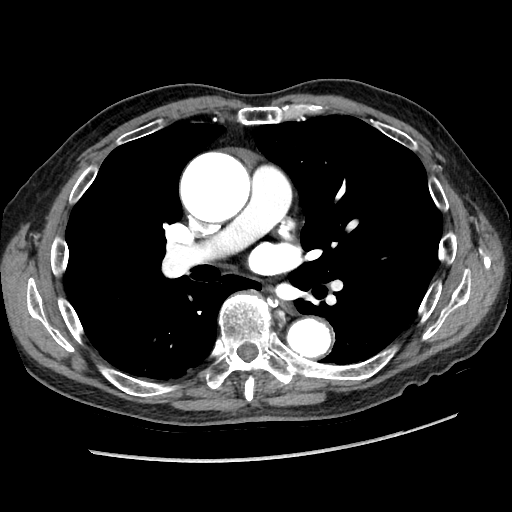
[im 85/113  lung]
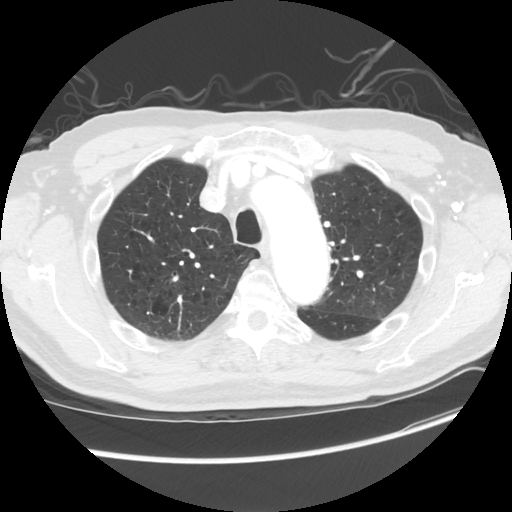

[Series 602: sagittal body · sagittal · 0.70mm/px · 5 of 145 slices shown]
[im 25/145  lung]
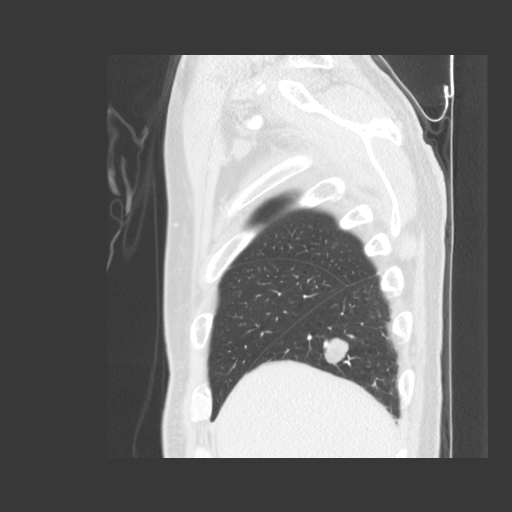
[im 49/145  lung]
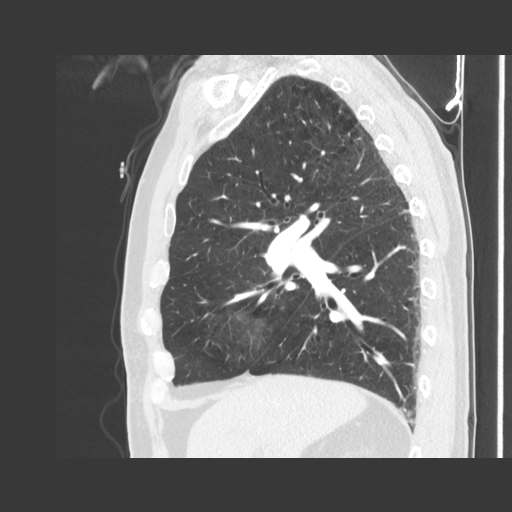
[im 73/145  lung]
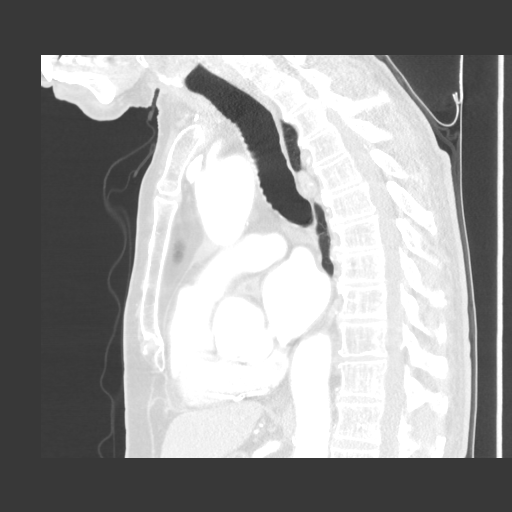
[im 97/145  lung]
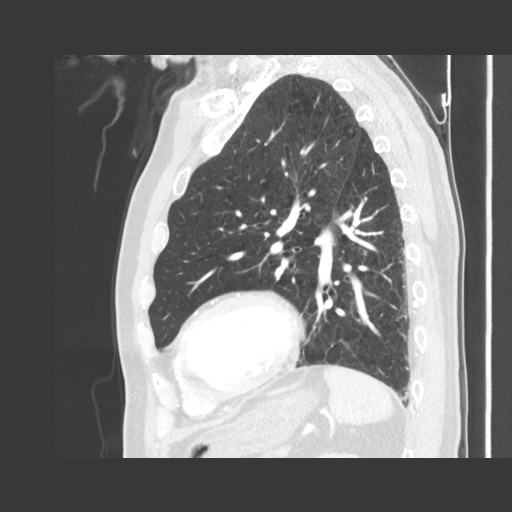
[im 121/145  lung]
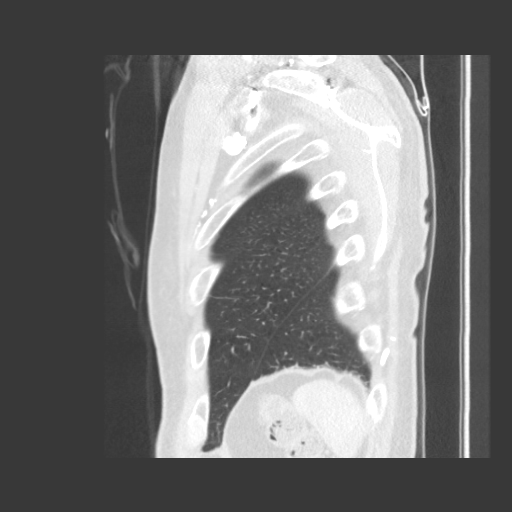

[8 of 30 positions shown; findings below may reference images not displayed]

FINDINGS: Cardiovascular: Heart size is normal. There is no significant
pericardial fluid, thickening or pericardial calcification. There is
aortic atherosclerosis, as well as atherosclerosis of the great
vessels of the mediastinum and the coronary arteries, including
calcified atherosclerotic plaque in the left anterior descending,
left circumflex and right coronary arteries. Aneurysmal dilatation
of the ascending thoracic aorta which measures up to 5.0 cm in
diameter. Mid aortic arch is ectatic measuring 3.7 cm in diameter.
Descending thoracic aorta measures 3.2 cm in diameter. No dissection
of the thoracic aorta or great vessels of the mediastinum is noted.

Mediastinum/Nodes: No pathologically enlarged mediastinal or hilar
lymph nodes. Esophagus is unremarkable in appearance. No axillary
lymphadenopathy.

Lungs/Pleura: In the right lower lobe there is a well-circumscribed
2.3 x 1.9 x 2.2 cm (coronal image 84 of series 601 and axial image
84 of series 5) pulmonary nodule which has slightly macrolobulated
margins and has internal areas of both soft tissue and fatty
attenuation, but no macroscopic calcification, strongly favored to
represent a pulmonary hamartoma. No other suspicious appearing
pulmonary nodules or masses are noted. Patchy areas of mild
bronchial wall thickening. There is no acute consolidative airspace
disease, and no pleural effusions. Mild to moderate centrilobular
and mild paraseptal emphysema.

Upper Abdomen: Aortic atherosclerosis.

Musculoskeletal: There are no aggressive appearing lytic or blastic
lesions noted in the visualized portions of the skeleton.

Review of the MIP images confirms the above findings.
IMPRESSION: 1. 5 cm ascending thoracic aortic aneurysm. Ascending thoracic
aortic aneurysm. Recommend semi-annual imaging followup by CTA or
MRA. This recommendation follows 4212
ACCF/AHA/AATS/ACR/ASA/SCA/LUCRESSE/MYLENE/ERXLEBEN/ORO Guidelines for the
Diagnosis and Management of Patients With Thoracic Aortic Disease.
Circulation. 4212; 121: [id number].
2. Aortic atherosclerosis, in addition to three-vessel coronary
artery disease. Assessment for potential risk factor modification,
dietary therapy or pharmacologic therapy may be warranted, if
clinically indicated.
3. Smoothly marginated 2.3 x 1.9 x 2.2 cm lipid containing nodule in
the right lower lobe is most compatible with a pulmonary MR Kyar.
This has been stable compared to prior study from 06/07/2016.
Continued attention at time of repeat CTA is recommended (as no more
remote prior studies are otherwise available for comparison).
4. Mild diffuse bronchial wall thickening with mild to moderate
centrilobular and mild paraseptal emphysema; imaging findings
suggestive of underlying COPD.

## 2017-01-09 ENCOUNTER — Other Ambulatory Visit: Payer: Self-pay | Admitting: *Deleted

## 2017-01-09 DIAGNOSIS — I712 Thoracic aortic aneurysm, without rupture, unspecified: Secondary | ICD-10-CM

## 2017-01-09 DIAGNOSIS — R911 Solitary pulmonary nodule: Secondary | ICD-10-CM

## 2017-01-16 ENCOUNTER — Other Ambulatory Visit: Payer: Self-pay | Admitting: Sports Medicine

## 2017-01-16 DIAGNOSIS — I10 Essential (primary) hypertension: Secondary | ICD-10-CM

## 2017-02-05 ENCOUNTER — Encounter: Payer: Medicare Other | Admitting: Surgery

## 2017-02-05 ENCOUNTER — Inpatient Hospital Stay: Admission: RE | Admit: 2017-02-05 | Payer: Medicare Other | Source: Ambulatory Visit

## 2017-02-10 ENCOUNTER — Encounter: Payer: Self-pay | Admitting: *Deleted

## 2017-03-04 ENCOUNTER — Telehealth: Payer: Self-pay | Admitting: Surgery

## 2017-04-30 ENCOUNTER — Other Ambulatory Visit: Payer: Self-pay

## 2017-04-30 DIAGNOSIS — I714 Abdominal aortic aneurysm, without rupture, unspecified: Secondary | ICD-10-CM

## 2017-05-08 ENCOUNTER — Telehealth: Payer: Self-pay | Admitting: Vascular Surgery

## 2017-05-08 NOTE — Telephone Encounter (Signed)
I scheduled a CTA abd/pelvis for this patient as ordered by Dr.Early on 07/09/16. His appt is for 08/19/17 at 10:30am at the [address] [facility]. He needs to arrive at 10am and also no solid foods 4 hours prior. He is also scheduled to see Dr.Early on that same date at 12 noon here at [facility]. I also mailed a letter with all of the above appt info as well. awt

## 2017-07-15 ENCOUNTER — Ambulatory Visit: Payer: Medicare Other | Admitting: Vascular Surgery

## 2017-08-06 ENCOUNTER — Other Ambulatory Visit: Payer: Self-pay | Admitting: Sports Medicine

## 2017-08-19 ENCOUNTER — Ambulatory Visit: Payer: Medicare Other | Admitting: Vascular Surgery

## 2017-08-19 ENCOUNTER — Other Ambulatory Visit: Payer: Medicare Other

## 2017-08-20 ENCOUNTER — Ambulatory Visit: Payer: Medicare Other | Admitting: Surgery

## 2017-10-21 ENCOUNTER — Other Ambulatory Visit: Payer: Medicare Other

## 2017-10-21 ENCOUNTER — Ambulatory Visit: Payer: Medicare Other | Admitting: Vascular Surgery

## 2023-02-05 NOTE — Telephone Encounter (Signed)
done

## 2023-06-26 DEATH — deceased
